# Patient Record
Sex: Male | Born: 1995 | Hispanic: No | Marital: Married | Smoking: Current some day smoker
Health system: Southern US, Community
[De-identification: ages and names within clinical notes are randomized; demographics above are authoritative.]

## PROBLEM LIST (undated history)

## (undated) DIAGNOSIS — F909 Attention-deficit hyperactivity disorder, unspecified type: Secondary | ICD-10-CM

## (undated) DIAGNOSIS — J45909 Unspecified asthma, uncomplicated: Secondary | ICD-10-CM

## (undated) DIAGNOSIS — I499 Cardiac arrhythmia, unspecified: Secondary | ICD-10-CM

## (undated) HISTORY — PX: OTHER SURGICAL HISTORY: SHX169

---

## 2019-04-15 ENCOUNTER — Ambulatory Visit: Payer: Self-pay | Attending: Internal Medicine

## 2019-04-15 DIAGNOSIS — Z23 Encounter for immunization: Secondary | ICD-10-CM

## 2019-04-15 NOTE — Progress Notes (Signed)
   Covid-19 Vaccination Clinic  Name:  Frederick Reynolds    MRN: [id number] DOB: February 26, 1995  04/15/2019  [name] was observed post Covid-19 immunization for 15 minutes without incident. He was provided with Vaccine Information Sheet and instruction to access the V-Safe system.   [name] was instructed to call 911 with any severe reactions post vaccine: Marland Kitchen Difficulty breathing  . Swelling of face and throat  . A fast heartbeat  . A bad rash all over body  . Dizziness and weakness   Immunizations Administered    Name Date Dose VIS Date Route   Pfizer COVID-19 Vaccine 04/15/2019 10:17 AM 0.3 mL 12/18/2018 Intramuscular   Manufacturer: [employer], [employer]   Lot: [id number]   NDC: [address]-8

## 2019-05-12 ENCOUNTER — Ambulatory Visit: Payer: Self-pay

## 2019-05-13 ENCOUNTER — Ambulatory Visit: Payer: Self-pay | Attending: Internal Medicine

## 2019-05-13 DIAGNOSIS — Z23 Encounter for immunization: Secondary | ICD-10-CM

## 2019-05-13 NOTE — Progress Notes (Signed)
   Covid-19 Vaccination Clinic  Name:  Darrick Greenlaw    MRN: [id number] DOB: Sep 27, 1995  05/13/2019  Mr. Garnett was observed post Covid-19 immunization for 15 minutes without incident. He was provided with Vaccine Information Sheet and instruction to access the V-Safe system.   Mr. Bogdon was instructed to call 911 with any severe reactions post vaccine: Marland Kitchen Difficulty breathing  . Swelling of face and throat  . A fast heartbeat  . A bad rash all over body  . Dizziness and weakness   Immunizations Administered    Name Date Dose VIS Date Route   Pfizer COVID-19 Vaccine 05/13/2019 12:30 PM 0.3 mL 03/03/2018 Intramuscular   Manufacturer: [employer], [employer]   Lot: [id number]   NDC: [address]-1

## 2019-05-17 ENCOUNTER — Ambulatory Visit: Payer: Self-pay | Attending: Internal Medicine

## 2019-09-27 ENCOUNTER — Other Ambulatory Visit: Payer: Self-pay

## 2019-09-27 ENCOUNTER — Emergency Department (INDEPENDENT_AMBULATORY_CARE_PROVIDER_SITE_OTHER)
Admission: EM | Admit: 2019-09-27 | Discharge: 2019-09-27 | Disposition: A | Discharge status: Home / Self Care | Payer: Self-pay | Source: Home / Self Care

## 2019-09-27 DIAGNOSIS — R519 Headache, unspecified: Secondary | ICD-10-CM

## 2019-09-27 DIAGNOSIS — R002 Palpitations: Secondary | ICD-10-CM

## 2019-09-27 DIAGNOSIS — R42 Dizziness and giddiness: Secondary | ICD-10-CM

## 2019-09-27 HISTORY — DX: Cardiac arrhythmia, unspecified: I49.9

## 2019-09-27 NOTE — ED Provider Notes (Signed)
[facility]    CSN: [id number] Arrival date & time: 09/27/19  1217      History   Chief Complaint Chief Complaint  Patient presents with  . Dizziness  . Headache  . Shortness of Breath    HPI Frederick Reynolds is a 24 y.o. male.   HPI  Frederick Reynolds is a 24 y.o. male presenting to UC with c/o 1 month of palpitations, intermittent headache, dizziness, and intermittent chest pain.  Pt reports having palpitations since he was a child and was even seen by [facility] when he was about 12 years ago, per medical records it was believed his Concerta may have contributed to some of his symptoms.  Pt had a normal echocardiogram at that time. He is not currently on any prescription medication or supplement but does report trying to cut back on caffeine intake.  He states yesterday after a run he developed worsening palpitations dizziness and HA. He has not taken any medication for the headache. Pt states he does not drink plain water but does drink [place] sparkling water.  No known medical problems besides the palpitations but pt is concerned he may have a brain tumor.  He did have a small tear in his retina, dx after he started seeing floaters a few weeks ago. The tear was unprovoked and surgically repaired. Pt states his vision is back to normal. He does not have a PCP because he moves frequently.  He is currently studying in hopes of getting into law school, sleeps about 6 hours a night.      Past Medical History:  Diagnosis Date  . Irregular heart beat     There are no problems to display for this patient.   Past Surgical History:  Procedure Laterality Date  . retina rupture          Home Medications    Prior to Admission medications   Not on File    Family History Family History  Problem Relation Age of Onset  . Hypertension Father     Social History Social History   Tobacco Use  . Smoking status: Current Some Day Smoker    Types: Cigars  .  Smokeless tobacco: Never Used  Substance Use Topics  . Alcohol use: Yes    Comment: occasionally   . Drug use: Never     Allergies   Patient has no known allergies.   Review of Systems Review of Systems  Constitutional: Negative for chills, fatigue and fever.  HENT: Negative for congestion, ear pain, sore throat, trouble swallowing and voice change.   Eyes: Negative for photophobia and visual disturbance.  Respiratory: Positive for shortness of breath. Negative for cough.   Cardiovascular: Positive for chest pain and palpitations.  Gastrointestinal: Negative for abdominal pain, diarrhea, nausea and vomiting.  Musculoskeletal: Negative for arthralgias, back pain and myalgias.  Skin: Negative for rash.  Neurological: Positive for dizziness and headaches. Negative for light-headedness.  All other systems reviewed and are negative.    Physical Exam Triage Vital Signs ED Triage Vitals  Enc Vitals Group     BP 09/27/19 1253 128/85     Pulse Rate 09/27/19 1253 71     Resp 09/27/19 1253 18     Temp 09/27/19 1253 97.9 F (36.6 C)     Temp Source 09/27/19 1253 Oral     SpO2 09/27/19 1253 99 %     Weight 09/27/19 1254 185 lb (83.9 kg)     Height 09/27/19 1254  5\' 9"  (1.753 m)     Head Circumference --      Peak Flow --      Pain Score 09/27/19 1254 0     Pain Loc --      Pain Edu? --      Excl. in GC? --    No data found.  Updated Vital Signs BP 128/85 (BP Location: Left Arm)   Pulse 71   Temp 97.9 F (36.6 C) (Oral)   Resp 18   Ht 5\' 9"  (1.753 m)   Wt 185 lb (83.9 kg)   SpO2 99%   BMI 27.32 kg/m   Visual Acuity Right Eye Distance:   Left Eye Distance:   Bilateral Distance:    Right Eye Near:   Left Eye Near:    Bilateral Near:     Physical Exam Vitals and nursing note reviewed.  Constitutional:      General: He is not in acute distress.    Appearance: He is well-developed. He is not ill-appearing, toxic-appearing or diaphoretic.  HENT:     Head:  Normocephalic and atraumatic.     Mouth/Throat:     Mouth: Mucous membranes are moist.     Pharynx: Oropharynx is clear.  Eyes:     General: No visual field deficit.    Extraocular Movements: Extraocular movements intact.     Right eye: Normal extraocular motion and no nystagmus.     Left eye: Normal extraocular motion and no nystagmus.     Pupils: Pupils are equal, round, and reactive to light.  Cardiovascular:     Rate and Rhythm: Normal rate and regular rhythm.  Pulmonary:     Effort: Pulmonary effort is normal. No respiratory distress.     Breath sounds: Normal breath sounds. No stridor. No wheezing, rhonchi or rales.  Musculoskeletal:        General: Normal range of motion.     Cervical back: Normal range of motion and neck supple. No rigidity.  Lymphadenopathy:     Cervical: No cervical adenopathy.  Skin:    General: Skin is warm and dry.     Capillary Refill: Capillary refill takes less than 2 seconds.  Neurological:     Mental Status: He is alert and oriented to person, place, and time.     GCS: GCS eye subscore is 4. GCS verbal subscore is 5. GCS motor subscore is 6.     Cranial Nerves: No cranial nerve deficit, dysarthria or facial asymmetry.     Sensory: No sensory deficit.     Motor: No weakness.     Coordination: Romberg sign negative. Coordination normal.     Gait: Gait normal.  Psychiatric:        Mood and Affect: Mood normal.        Behavior: Behavior normal.      UC Treatments / Results  Labs (all labs ordered are listed, but only abnormal results are displayed) Labs Reviewed - No data to display  EKG Date/Time: 09/27/2019    13:19:37 Ventricular Rate: 62 PR Interval: 212 QRS Duration: 86 QT Interval: 364 QTC Calculation: 369 P-R-T axes: 54   74   48 Text Interpretation: Sinus rhythm with sinus arrhythmia with 1st degree AV block. Otherwise normal ECG.   Radiology No results found.  Procedures Procedures (including critical care  time)  Medications Ordered in UC Medications - No data to display  Initial Impression / Assessment and Plan / UC Course  I have reviewed the triage  vital signs and the nursing notes.  Pertinent labs & imaging results that were available during my care of the patient were reviewed by me and considered in my medical decision making (see chart for details).     Reassured pt of overall normal EKG, however, due to bothersome recurrent palpitations, ambulatory referral to cardiology placed today. Pt may benefit from a holter monitor. Normal neuro exam today. Discussed head CT today for intermittent HA and dizziness, will hold off for now due to cost concerns. Discussed signs/symptoms that warrant emergency care or recheck later this week. Encouraged good hydration, at least 8 hours of sleep, may alternate Tylenol and ibuprofen. Encouraged to establish care with PCP even if just for a few more months in the area. AVS given  Final Clinical Impressions(s) / UC Diagnoses   Final diagnoses:  Generalized headache  Dizziness  Palpitations     Discharge Instructions      It is highly recommended you establish care with a primary care provider even if you only plan to be in the area a few more months. A referral has been placed for cardiology as you may benefit from an event monitor that can be worn for several hours or days.  In the meantime, try to get plenty of sleep, at least 8 hours a night, stay well hydrated so your urine stays clear to pale yellow, try alternating Tylenol and Motrin as needed for pain.  Follow up later this week if headache persists or worsens.   Call 911 or have someone drive you to the hospital for further evaluation and treatment of symptoms if you develop worsening headache, change in vision, worsening dizziness/falling/passing out, one sided weakness or numbness, or other new concerning symptoms develop.     ED Prescriptions    None     I have reviewed the  PDMP during this encounter.   Lurene Shadow, [place] 09/27/19 1514

## 2019-09-27 NOTE — Discharge Instructions (Signed)
  It is highly recommended you establish care with a primary care provider even if you only plan to be in the area a few more months. A referral has been placed for cardiology as you may benefit from an event monitor that can be worn for several hours or days.  In the meantime, try to get plenty of sleep, at least 8 hours a night, stay well hydrated so your urine stays clear to pale yellow, try alternating Tylenol and Motrin as needed for pain.  Follow up later this week if headache persists or worsens.   Call 911 or have someone drive you to the hospital for further evaluation and treatment of symptoms if you develop worsening headache, change in vision, worsening dizziness/falling/passing out, one sided weakness or numbness, or other new concerning symptoms develop.

## 2019-09-27 NOTE — ED Triage Notes (Signed)
Pt reports having palpitation, vertigo, dizziness, intermittent chest pain for about a month. Of note, pt has had covid last year but has been fully immunized. He reports having an irregular heart beat as a child. He moves around a lot so he does not have established PCP or seen a cardiologist for this condition.

## 2019-09-28 ENCOUNTER — Telehealth: Payer: Self-pay | Admitting: Emergency Medicine

## 2019-09-28 NOTE — Telephone Encounter (Signed)
2nd attempt to call patient back - no answer

## 2019-09-28 NOTE — Telephone Encounter (Signed)
Call to check on pt status, no answer - will try again later on

## 2019-10-25 ENCOUNTER — Emergency Department (INDEPENDENT_AMBULATORY_CARE_PROVIDER_SITE_OTHER): Payer: Self-pay

## 2019-10-25 ENCOUNTER — Other Ambulatory Visit: Payer: Self-pay

## 2019-10-25 ENCOUNTER — Encounter: Payer: Self-pay | Admitting: Emergency Medicine

## 2019-10-25 ENCOUNTER — Emergency Department (INDEPENDENT_AMBULATORY_CARE_PROVIDER_SITE_OTHER)
Admission: EM | Admit: 2019-10-25 | Discharge: 2019-10-25 | Disposition: A | Discharge status: Home / Self Care | Payer: Self-pay | Source: Home / Self Care

## 2019-10-25 DIAGNOSIS — R519 Headache, unspecified: Secondary | ICD-10-CM

## 2019-10-25 DIAGNOSIS — R42 Dizziness and giddiness: Secondary | ICD-10-CM

## 2019-10-25 MED ORDER — MECLIZINE HCL 25 MG PO TABS: 25.0000 mg | ORAL_TABLET | Freq: Two times a day (BID) | ORAL | 0 refills | Status: AC | PRN

## 2019-10-25 NOTE — ED Triage Notes (Addendum)
Dizziness x 3 weeks, nausea, headaches, denies palpitations. Wants CT Declined Zofran for nausea

## 2019-10-25 NOTE — Discharge Instructions (Signed)
  Due to your ongoing symptoms for at least 1 month, it is recommended you establish care with a primary care provider for ongoing healthcare needs and recheck of symptoms.  You may try the prescribed medication to help with your dizziness: Antivert (meclizine) is a medication to help with dizziness and nausea related to vertigo.  This medication can cause drowsiness. Do not operate heavy machinery or drive while taking.    Call 911 or have someone drive you to the hospital if symptoms significantly worsening- worsening headache, change in vision, passing out, or other new concerning symptoms develop.

## 2019-10-28 NOTE — ED Provider Notes (Signed)
[facility]    CSN: [id number] Arrival date & time: 10/25/19  1235      History   Chief Complaint Chief Complaint  Patient presents with  . Dizziness    HPI Frederick Reynolds is a 24 y.o. male.   HPI  Frederick Reynolds is a 24 y.o. male presenting to UC with c/o 3 weeks of intermittent dizziness, nausea and generalized HA.  Pt was seen at [facility] on 09/27/19 with similar complaints. Pt was encouraged to f/u with PCP and cardiology for reported palpitations at that time. Pt has not f/u since last UC visit. States the palpitations have been going on since childhood, pt more concerned about dizziness and HA today. Requesting a CT scan.  He notes recently going to [facility]. and having a dizzy spell so bad he almost fell, but felt better after drinking some water. Pt unsure if he was dehydrated or just needed to rest.  HA is aching and sore, all over. Dizziness worse with certain head movements. Denies cough, congestion, fever, chills, n/v/d. No ear pain or vision loss. No medications tried PTA but his wife, who works in neurology, tried the Teaching laboratory technician thinking symptoms were vertigo but no relief.     Past Medical History:  Diagnosis Date  . Irregular heart beat     There are no problems to display for this patient.   Past Surgical History:  Procedure Laterality Date  . retina rupture          Home Medications    Prior to Admission medications   Medication Sig Start Date End Date Taking? Authorizing Provider  meclizine (ANTIVERT) 25 MG tablet Take 1 tablet (25 mg total) by mouth 2 (two) times daily as needed for dizziness or nausea. 10/25/19   Lurene Shadow, PA-C    Family History Family History  Problem Relation Age of Onset  . Hypertension Father   . Healthy Mother     Social History Social History   Tobacco Use  . Smoking status: Current Some Day Smoker    Types: Cigars  . Smokeless tobacco: Never Used  Vaping Use  . Vaping Use: Never used    Substance Use Topics  . Alcohol use: Yes    Comment: occasionally   . Drug use: Never     Allergies   Patient has no known allergies.   Review of Systems Review of Systems  Constitutional: Negative for chills and fever.  HENT: Negative for congestion.   Eyes: Negative for photophobia, pain and visual disturbance.  Gastrointestinal: Negative for nausea and vomiting.  Musculoskeletal: Negative for back pain, neck pain and neck stiffness.  Neurological: Positive for dizziness and headaches. Negative for weakness and light-headedness.     Physical Exam Triage Vital Signs ED Triage Vitals  Enc Vitals Group     BP 10/25/19 1336 (!) 144/83     Pulse Rate 10/25/19 1336 82     Resp --      Temp 10/25/19 1336 98.5 F (36.9 C)     Temp Source 10/25/19 1336 Oral     SpO2 10/25/19 1336 98 %     Weight 10/25/19 1337 188 lb (85.3 kg)     Height 10/25/19 1337 5\' 9"  (1.753 m)     Head Circumference --      Peak Flow --      Pain Score 10/25/19 1337 0     Pain Loc --      Pain Edu? --  Excl. in GC? --    No data found.  Updated Vital Signs BP (!) 144/83 (BP Location: Right Arm)   Pulse 82   Temp 98.5 F (36.9 C) (Oral)   Ht 5\' 9"  (1.753 m)   Wt 188 lb (85.3 kg)   SpO2 98%   BMI 27.76 kg/m   Visual Acuity Right Eye Distance:   Left Eye Distance:   Bilateral Distance:    Right Eye Near:   Left Eye Near:    Bilateral Near:     Physical Exam Vitals and nursing note reviewed.  Constitutional:      General: He is not in acute distress.    Appearance: Normal appearance. He is well-developed. He is not ill-appearing, toxic-appearing or diaphoretic.  HENT:     Head: Normocephalic and atraumatic.     Right Ear: Tympanic membrane and ear canal normal.     Left Ear: Tympanic membrane and ear canal normal.     Nose: Nose normal.     Mouth/Throat:     Mouth: Mucous membranes are moist.     Pharynx: Oropharynx is clear.  Eyes:     Extraocular Movements: Extraocular  movements intact.     Conjunctiva/sclera: Conjunctivae normal.     Pupils: Pupils are equal, round, and reactive to light.  Cardiovascular:     Rate and Rhythm: Normal rate and regular rhythm.  Pulmonary:     Effort: Pulmonary effort is normal. No respiratory distress.     Breath sounds: Normal breath sounds.  Musculoskeletal:        General: Normal range of motion.     Cervical back: Normal range of motion and neck supple. No rigidity or tenderness.  Lymphadenopathy:     Cervical: No cervical adenopathy.  Skin:    General: Skin is warm and dry.     Capillary Refill: Capillary refill takes less than 2 seconds.  Neurological:     General: No focal deficit present.     Mental Status: He is alert and oriented to person, place, and time.     Cranial Nerves: No cranial nerve deficit.     Sensory: No sensory deficit.     Motor: No weakness.     Coordination: Coordination normal.     Gait: Gait normal.  Psychiatric:        Mood and Affect: Mood normal.        Behavior: Behavior normal.      UC Treatments / Results  Labs (all labs ordered are listed, but only abnormal results are displayed) Labs Reviewed - No data to display  EKG   Radiology Narrative & Impression  CLINICAL DATA:  Headaches and dizziness for the past 3 weeks.  EXAM: CT HEAD WITHOUT CONTRAST  TECHNIQUE: Contiguous axial images were obtained from the base of the skull through the vertex without intravenous contrast.  COMPARISON:  None.  FINDINGS: Brain: No evidence of acute infarction, hemorrhage, hydrocephalus, extra-axial collection or mass lesion/mass effect.  Vascular: No hyperdense vessel or unexpected calcification.  Skull: Normal. Negative for fracture or focal lesion.  Sinuses/Orbits: No acute finding.  Other: None.  IMPRESSION: 1. Normal noncontrast head CT.   Electronically Signed   By: M.D.   On: 10/25/2019 15:04      Procedures Procedures  (including critical care time)  Medications Ordered in UC Medications - No data to display  Initial Impression / Assessment and Plan / UC Course  I have reviewed the triage vital signs and  the nursing notes.  Pertinent labs & imaging results that were available during my care of the patient were reviewed by me and considered in my medical decision making (see chart for details).     Reassured pt of normal head CT and orthostatic vitals May try trial of meclizine Encouraged to establish care with a PCP for further evaluation and treatment of recurrent symptoms.  Discussed symptoms that warrant emergent care in the ED. AVS given  Final Clinical Impressions(s) / UC Diagnoses   Final diagnoses:  Dizziness  Recurrent headache     Discharge Instructions      Due to your ongoing symptoms for at least 1 month, it is recommended you establish care with a primary care provider for ongoing healthcare needs and recheck of symptoms.  You may try the prescribed medication to help with your dizziness: Antivert (meclizine) is a medication to help with dizziness and nausea related to vertigo.  This medication can cause drowsiness. Do not operate heavy machinery or drive while taking.    Call 911 or have someone drive you to the hospital if symptoms significantly worsening- worsening headache, change in vision, passing out, or other new concerning symptoms develop.       ED Prescriptions    Medication Sig Dispense Auth. Provider   meclizine (ANTIVERT) 25 MG tablet Take 1 tablet (25 mg total) by mouth 2 (two) times daily as needed for dizziness or nausea. 9 tablet Lurene Shadow, [place]     PDMP not reviewed this encounter.   Lurene Shadow, [place] 10/28/19 1151

## 2019-12-07 ENCOUNTER — Telehealth: Payer: Self-pay | Admitting: Emergency Medicine

## 2019-12-07 NOTE — Telephone Encounter (Signed)
Call from [facility] to sse who is was supposed to follow up with from last visit at [facility] - PCP  Was Dr Everrett Coombe & ENT was [facility]. RN explained that contact info was on his AVS & in My Chart if he had set it up w/ [facility]. RN offered to give Louis the contact #'s for both, but he stated he had the info.

## 2020-10-05 ENCOUNTER — Emergency Department (HOSPITAL_BASED_OUTPATIENT_CLINIC_OR_DEPARTMENT_OTHER): Admission: EM | Admit: 2020-10-05 | Discharge: 2020-10-05 | Discharge status: Against Medical Advice | Payer: Self-pay

## 2020-10-05 ENCOUNTER — Other Ambulatory Visit: Payer: Self-pay

## 2020-10-06 ENCOUNTER — Emergency Department (HOSPITAL_BASED_OUTPATIENT_CLINIC_OR_DEPARTMENT_OTHER)
Admission: EM | Admit: 2020-10-06 | Discharge: 2020-10-06 | Disposition: A | Discharge status: Home / Self Care | Payer: Self-pay | Attending: Emergency Medicine | Admitting: Emergency Medicine

## 2020-10-06 ENCOUNTER — Encounter (HOSPITAL_BASED_OUTPATIENT_CLINIC_OR_DEPARTMENT_OTHER): Payer: Self-pay | Admitting: Emergency Medicine

## 2020-10-06 DIAGNOSIS — J45909 Unspecified asthma, uncomplicated: Secondary | ICD-10-CM | POA: Diagnosis not present

## 2020-10-06 DIAGNOSIS — Z87891 Personal history of nicotine dependence: Secondary | ICD-10-CM | POA: Insufficient documentation

## 2020-10-06 DIAGNOSIS — Z760 Encounter for issue of repeat prescription: Secondary | ICD-10-CM | POA: Insufficient documentation

## 2020-10-06 HISTORY — DX: Unspecified asthma, uncomplicated: J45.909

## 2020-10-06 MED ORDER — ALBUTEROL SULFATE HFA 108 (90 BASE) MCG/ACT IN AERS
2.0000 | INHALATION_SPRAY | RESPIRATORY_TRACT | Status: DC | PRN
  Administered 2020-10-06: 2 via RESPIRATORY_TRACT
  Filled 2020-10-06: qty 6.7

## 2020-10-06 MED ORDER — PANTOPRAZOLE SODIUM 40 MG PO TBEC
40.0000 mg | DELAYED_RELEASE_TABLET | Freq: Once | ORAL | Status: AC
  Administered 2020-10-06: 40 mg via ORAL
  Filled 2020-10-06: qty 1

## 2020-10-06 NOTE — ED Provider Notes (Signed)
[facility] Provider Note: Lowella Dell, MD, FACEP  CSN: [id number] MRN: [id number] ARRIVAL: 10/06/20 at 0505 ROOM: MH02/MH02   CHIEF COMPLAINT  Medication Refill   HISTORY OF PRESENT ILLNESS  10/06/20 5:25 AM Frederick Reynolds is a 25 y.o. male with a history of asthma.  He is here requesting a new inhaler.  He thinks he lost his old one yesterday during a move.  He is not significantly short of breath at the present time but it gets worse when he is lying down at night.  It is associated with a burning sensation in his lungs.  He does not believe he has acid reflux and has never been told he had acid reflux.  He has never been treated for acid reflux.    Past Medical History:  Diagnosis Date   Asthma    Irregular heart beat     Past Surgical History:  Procedure Laterality Date   retina rupture       Family History  Problem Relation Age of Onset   Hypertension Father    Healthy Mother     Social History   Tobacco Use   Smoking status: Former    Types: Cigars   Smokeless tobacco: Never  Vaping Use   Vaping Use: Never used  Substance Use Topics   Alcohol use: Yes    Comment: occasionally    Drug use: Never    Prior to Admission medications   Medication Sig Start Date End Date Taking? Authorizing Provider  meclizine (ANTIVERT) 25 MG tablet Take 1 tablet (25 mg total) by mouth 2 (two) times daily as needed for dizziness or nausea. 10/25/19   Lurene Shadow, PA-C    Allergies Patient has no known allergies.   REVIEW OF SYSTEMS  Negative except as noted here or in the History of Present Illness.   PHYSICAL EXAMINATION  Initial Vital Signs Blood pressure 122/79, pulse 62, temperature 98.3 F (36.8 C), temperature source Oral, resp. rate 20, height 5\' 7"  (1.702 m), weight 86.2 kg, SpO2 96 %.  Examination General: Well-developed, well-nourished male in no acute distress; appearance consistent with age of record HENT: normocephalic;  atraumatic Eyes: Normal appearance Neck: supple Heart: regular rate and rhythm Lungs: clear to auscultation bilaterally; occasional cough Abdomen: soft; nondistended; nontender; bowel sounds present Extremities: No deformity; full range of motion Neurologic: Awake, alert and oriented; motor function intact in all extremities and symmetric; no facial droop Skin: Warm and dry Psychiatric: Normal mood and affect   RESULTS  Summary of this visit's results, reviewed and interpreted by myself:   EKG Interpretation  Date/Time:    Ventricular Rate:    PR Interval:    QRS Duration:   QT Interval:    QTC Calculation:   R Axis:     Text Interpretation:         Laboratory Studies: No results found for this or any previous visit (from the past 24 hour(s)). Imaging Studies: No results found.  ED COURSE and MDM  Nursing notes, initial and subsequent vitals signs, including pulse oximetry, reviewed and interpreted by myself.  Vitals:   10/06/20 0514 10/06/20 0515  BP:  122/79  Pulse:  62  Resp:  20  Temp:  98.3 F (36.8 C)  TempSrc:  Oral  SpO2:  96%  Weight: 86.2 kg   Height: 5\' 7"  (1.702 m)    Medications  pantoprazole (PROTONIX) EC tablet 40 mg (has no administration in time range)  albuterol (VENTOLIN HFA)  108 (90 Base) MCG/ACT inhaler 2 puff (has no administration in time range)    Patient advised he may benefit from taking an acid blocker such as Prilosec or Prevacid OTC.  Exacerbation of symptoms while at night is often a sign of acid reflux.  PROCEDURES  Procedures   ED DIAGNOSES     ICD-10-CM   1. Encounter for medication refill  Z76.0          Kaisyn Millea, Jonny Ruiz, MD 10/06/20 [phone]

## 2020-10-06 NOTE — ED Triage Notes (Signed)
Pt request new inhaler. Thinks lost old one yesterday during a move.

## 2020-10-09 ENCOUNTER — Ambulatory Visit (INDEPENDENT_AMBULATORY_CARE_PROVIDER_SITE_OTHER): Payer: Self-pay

## 2020-10-09 ENCOUNTER — Telehealth: Payer: Self-pay | Admitting: Pulmonary Disease

## 2020-10-09 ENCOUNTER — Encounter: Payer: Self-pay | Admitting: Pulmonary Disease

## 2020-10-09 ENCOUNTER — Other Ambulatory Visit: Payer: Self-pay

## 2020-10-09 ENCOUNTER — Ambulatory Visit (INDEPENDENT_AMBULATORY_CARE_PROVIDER_SITE_OTHER): Payer: Self-pay | Admitting: Pulmonary Disease

## 2020-10-09 VITALS — BP 118/82 | HR 69 | Temp 98.2°F | Ht 67.0 in | Wt 191.4 lb

## 2020-10-09 DIAGNOSIS — J42 Unspecified chronic bronchitis: Secondary | ICD-10-CM

## 2020-10-09 MED ORDER — TRELEGY ELLIPTA 200-62.5-25 MCG/INH IN AEPB: 1.0000 | INHALATION_SPRAY | Freq: Every day | RESPIRATORY_TRACT | 0 refills | Status: AC

## 2020-10-09 NOTE — Progress Notes (Signed)
Subjective:   [name]: Frederick Reynolds GENDER: male DOB: 28-Apr-1995, MRN: [id number]   HPI  Chief Complaint  Patient presents with   Consult    After covid, at night patient has noticed a wheezing sound, with burning sensation in chest. SOB has worsened since COVID.   Reason for Visit: New consult for shortness of breath  Mr. Frederick Reynolds is a 25 year old male negligible smoke history with allergic rhinitis, asthma and ADHD who presents as a new consult.  He was referred by his PCP for shortness of breath concerning for post-covid vs asthma. Records reviewed including PCP note on 09/05/20. He recently moved to [place]. Diagnosed with COVID-19 in 2020. Evaluated in the ED however did not require hospitalization. Has tried albuterol inhaler with improvement. He continues to have cough but able to perform regular activities including running and lifting. Has also noticed palpitations that has previously been worked up with monitor in the past. He was started on Symbicort and singulair. Labs reviewed and significant for elevated Cr (1.66) and normal BUN. WBC 5.7 - no diff available.  He reports worsening allergies including sneezing, cough, wheezing, nasal congestion in the last year. At night he reports he can feel and hear vibration in his left chest that feels like it is burning. His wife has performed percussions and sometimes will produce sputum with relief. He is active at baseline and runs, weight lifting, running. Denies symptoms with activity. He has not taken Symbicort due to insurance. He feels albuterol improves.  Increased humidity worsen symptoms but going outside helps with his symptoms. He is concerned that his prior apartment may have had mold exposure. Recently started having a bunny.   Social History: Smoked cigars No vaping or illicit drug use Denies childhood illnesses Martial arts Bunny in home   I have personally reviewed patient's past medical/family/social  history, allergies, current medications.  Past Medical History:  Diagnosis Date   Asthma    Irregular heart beat      Family History  Problem Relation Age of Onset   Hypertension Father    Healthy Mother      Social History   Occupational History   Not on file  Tobacco Use   Smoking status: Former    Types: Cigars   Smokeless tobacco: Never  Vaping Use   Vaping Use: Never used  Substance and Sexual Activity   Alcohol use: Yes    Comment: occasionally    Drug use: Never   Sexual activity: Yes    No Known Allergies   Outpatient Medications Prior to Visit  Medication Sig Dispense Refill   meclizine (ANTIVERT) 25 MG tablet Take 1 tablet (25 mg total) by mouth 2 (two) times daily as needed for dizziness or nausea. 9 tablet 0   No facility-administered medications prior to visit.    Review of Systems  Constitutional:  Negative for chills, diaphoresis, fever, malaise/fatigue and weight loss.  HENT:  Positive for congestion. Negative for ear pain and sore throat.   Respiratory:  Positive for cough and shortness of breath. Negative for hemoptysis, sputum production and wheezing.   Cardiovascular:  Negative for chest pain, palpitations and leg swelling.  Gastrointestinal:  Negative for abdominal pain, heartburn and nausea.  Genitourinary:  Negative for frequency.  Musculoskeletal:  Negative for joint pain and myalgias.  Skin:  Negative for itching and rash.  Neurological:  Negative for dizziness, weakness and headaches.  Endo/Heme/Allergies:  Does not bruise/bleed easily.  Psychiatric/Behavioral:  Negative  for depression. The patient is not nervous/anxious.     Objective:   Vitals:   10/09/20 1527  BP: 118/82  Pulse: 69  Temp: 98.2 F (36.8 C)  SpO2: 97%  Weight: 191 lb 6.4 oz (86.8 kg)  Height: _0  (1.702 m)      Physical Exam: General: Well-appearing, no acute distress HENT: [place], AT Eyes: EOMI, no scleral icterus Respiratory: Clear to auscultation  bilaterally.  No crackles, wheezing or rales Cardiovascular: RRR, -M/R/G, no JVD Extremities:-Edema,-tenderness Neuro: AAO x4, CNII-XII grossly intact Psych: Normal mood, normal affect  Data Reviewed:  Imaging: No chest imaging available   PFT: None on file  Labs: CBC No results found for: WBC, RBC, HGB, HCT, PLT, MCV, MCH, MCHC, RDW, LYMPHSABS, MONOABS, EOSABS, BASOSABS OSH Labs reviewed and significant for elevated Cr (1.66) and normal BUN. WBC 5.7 - no diff available.    Assessment & Plan:   Discussion: 25 year old male negligible smoke history with allergic rhinitis and ADHD who presents for new consult. He reports myriad of symptoms that could be allergy/asthma-related. We discussed conservative management including avoidance of allergens but will also evaluation with PFTs to rule out underlying obstructive/restrictive defect. His history of COVID in 2020 may contribute to this however unclear at this time.  Chronic bronchitis START Breztri TWO puffs TWICE a day ARRANGE pulmonary function tests Obtain CXR today  Health Maintenance Immunization History  Administered Date(s) Administered   PFIZER(Purple Top)SARS-COV-2 Vaccination 04/15/2019, 05/13/2019   CT Lung Screen - not indicated  Orders Placed This Encounter  Procedures   DG Chest 2 View    Standing Status:   Future    Number of Occurrences:   1    Standing Expiration Date:   10/09/2021    Order Specific Question:   Reason for Exam (SYMPTOM  OR DIAGNOSIS REQUIRED)    Answer:   chronic bronchitis    Order Specific Question:   Preferred imaging location?    Answer:   Internal   Pulmonary function test    Standing Status:   Future    Standing Expiration Date:   10/09/2021    Order Specific Question:   Where should this test be performed?    Answer:   [facility]    Order Specific Question:   Full PFT: includes the following: basic spirometry, spirometry pre & post bronchodilator, diffusion capacity (DLCO),  lung volumes    Answer:   Full PFT   Meds ordered this encounter  Medications   Fluticasone-Umeclidin-Vilant (TRELEGY ELLIPTA) 200-62.5-25 MCG/INH AEPB    Sig: Inhale 1 puff into the lungs daily.    Dispense:  14 each    Refill:  0    Order Specific Question:   Lot Number?    Answer:   [id number]    Order Specific Question:   Expiration Date?    Answer:   03/08/2022    Order Specific Question:   Manufacturer?    Answer:   [employer] [[id number]]    Order Specific Question:   Quantity    Answer:   1    No follow-ups on file. After PFTs  I have spent a total time of 45-minutes on the day of the appointment reviewing prior documentation, coordinating care and discussing medical diagnosis and plan with the patient/family. Imaging, labs and tests included in this note have been reviewed and interpreted independently by me.  [place], MD [facility] 10/09/2020 3:25 PM  Office Number [phone]

## 2020-10-09 NOTE — Patient Instructions (Addendum)
Chronic bronchitis START Trelegy ONE puff ONCE a day ARRANGE pulmonary function tests Obtain CXR today  Please call our office to let me know how well the inhaler is working  Follow-up with me after PFTs

## 2020-10-09 NOTE — Telephone Encounter (Signed)
Dr. Everardo All reviewed pt's cxr and stated that the cxr showed no acute abnormalities.   Tried to call pt to let him know the results of the cxr and also to schedule pt's f/u appt but unable to reach and unable to leave VM due to mailbox being full.  Will try to call back later.  Pt does need to be scheduled for a PFT and F/U with Dr. Everardo All next avail.

## 2020-10-11 NOTE — Telephone Encounter (Signed)
Patient returned call, provided results per Dr. Everardo All.  He verbalized understanding.  Patient cannot have PFT and f/u in our office as we are not in network for his insurance.  Advised I would make Dr. Everardo All aware.

## 2020-10-11 NOTE — Telephone Encounter (Signed)
Routing to front desk pool for help in trying to get pt scheduled for the follow up. Pt will need to have 1 hour PFT prior to OV with Dr. Everardo All scheduled next avail.

## 2020-10-13 NOTE — Telephone Encounter (Signed)
What is the standard protocol to verify patient is in network with our office?

## 2020-10-13 NOTE — Telephone Encounter (Signed)
Thanks for info. Our office will await patient if he wishes to pursue further care with [place].

## 2020-10-13 NOTE — Telephone Encounter (Signed)
Typically we have the patient contact their insurance to confirm. Left message with patient to confirm. Since he is telling us we are not in network he probably already has contacted them or been contacted by them Only other option is to have him referred to a pulmonologist in network or ask if he is willing to pay out of pocket. [facility] does automatically give a discount for those that pay out of pocket.

## 2020-11-10 IMAGING — CT CT HEAD W/O CM
3 of 4 series · 16 of 47 positions shown, 19 images · non-contrast
Comparison: None.

CLINICAL DATA: Headaches and dizziness for the past 3 weeks.

EXAM:
CT HEAD WITHOUT CONTRAST
TECHNIQUE: Contiguous axial images were obtained from the base of the skull
through the vertex without intravenous contrast.

[Series 2: head wo · axial · 0.43mm/px · z∈[-184,-48]mm · 10 of 33 slices shown, 13 images]
[im 3/33  brain]
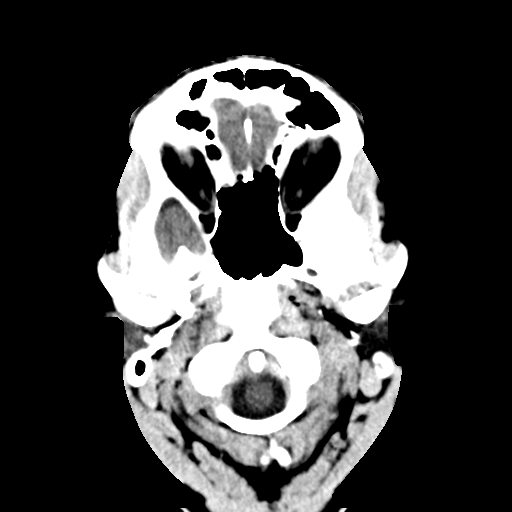
[im 3/33  bone]
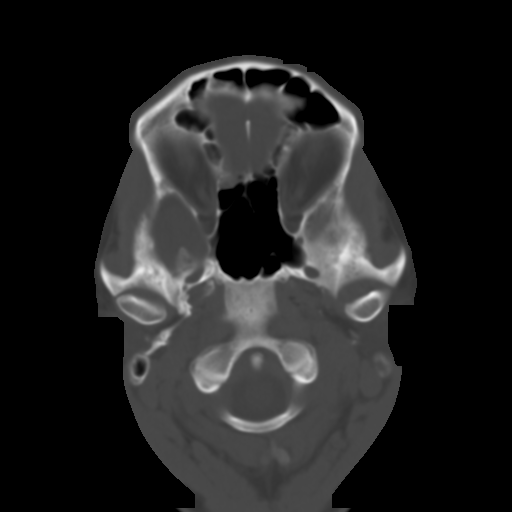
[im 5/33  brain]
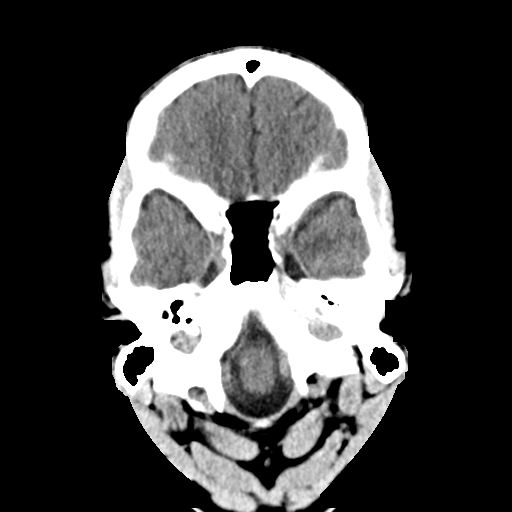
[im 10/33  brain]
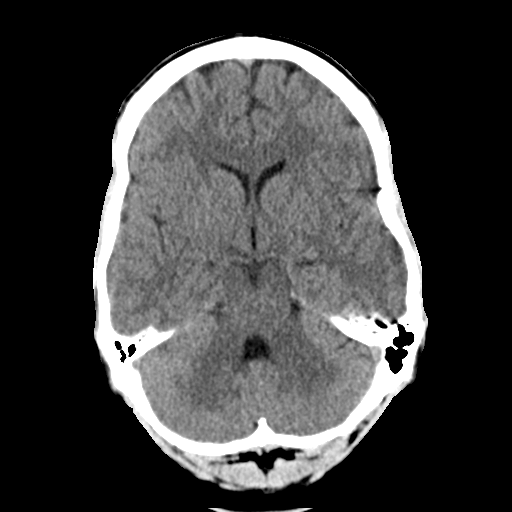
[im 12/33  brain]
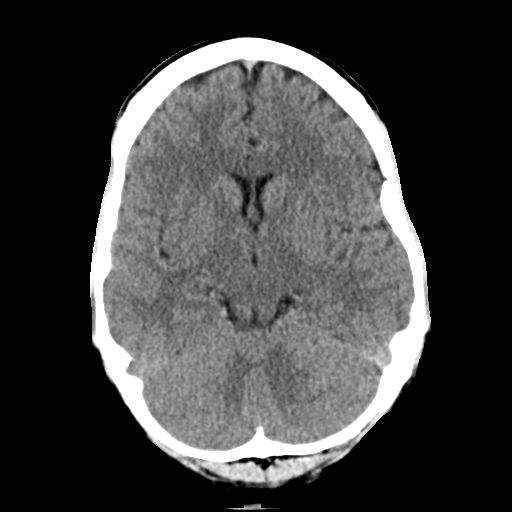
[im 14/33  brain]
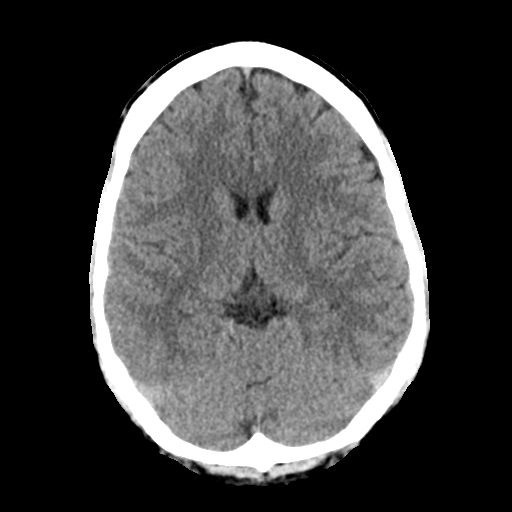
[im 14/33  bone]
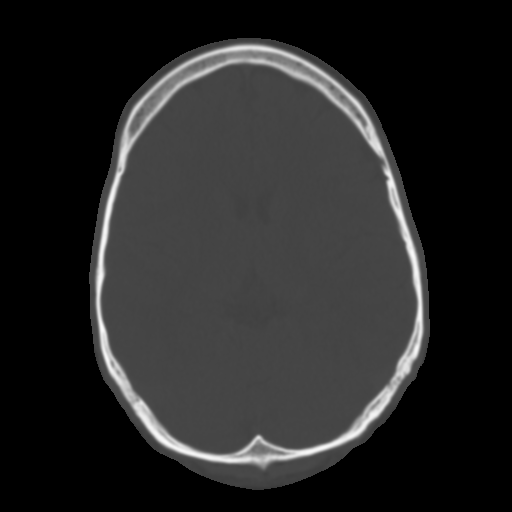
[im 19/33  brain]
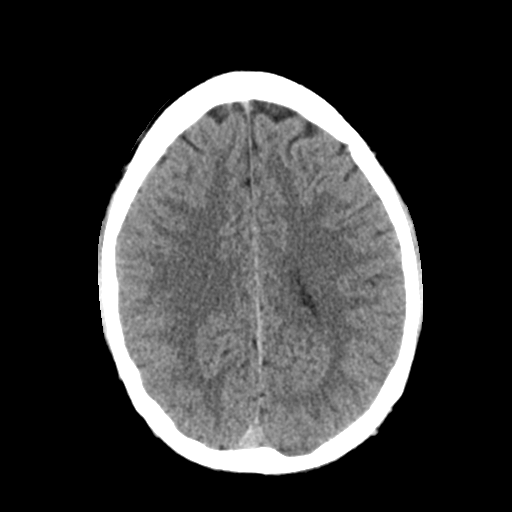
[im 21/33  brain]
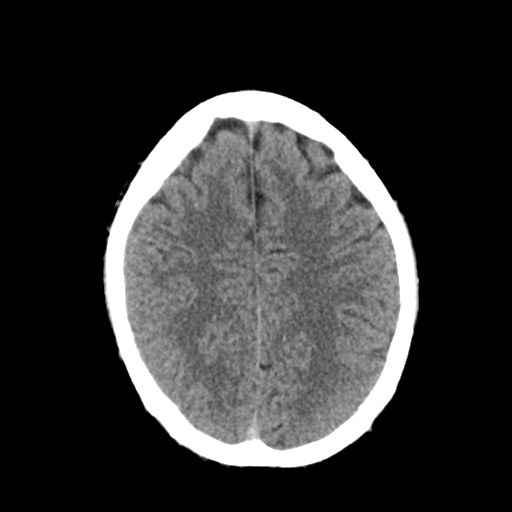
[im 23/33  brain]
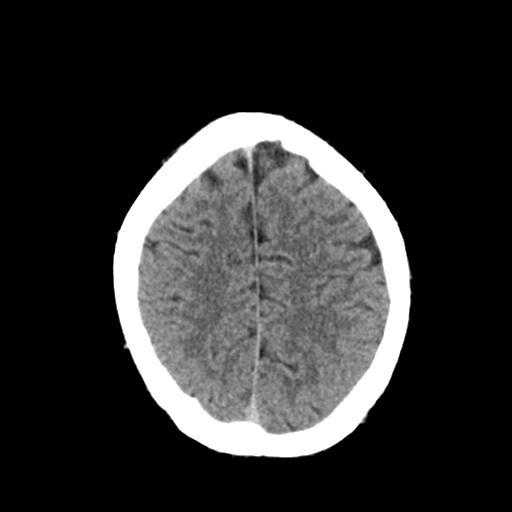
[im 28/33  brain]
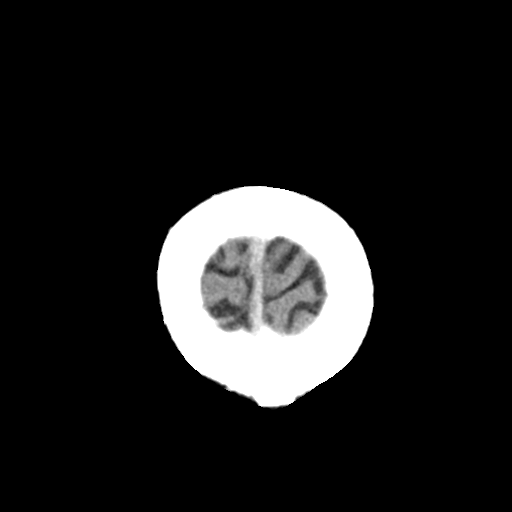
[im 28/33  bone]
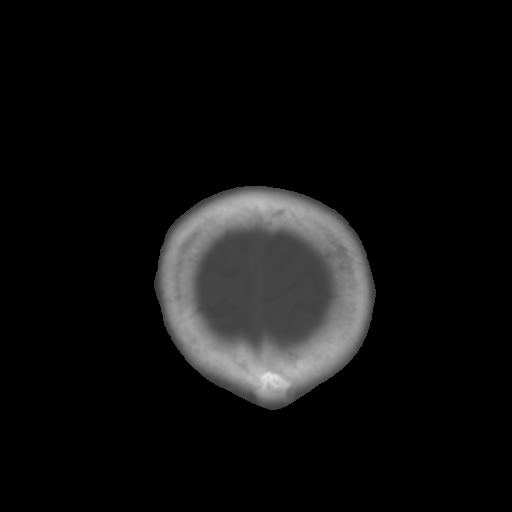
[im 30/33  brain]
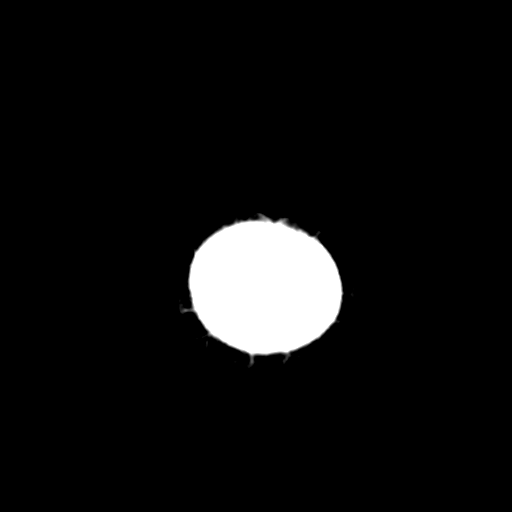

[Series 4: head wo coronal · coronal · 0.33mm/px · 3 of 72 slices shown]
[im 24/72  brain]
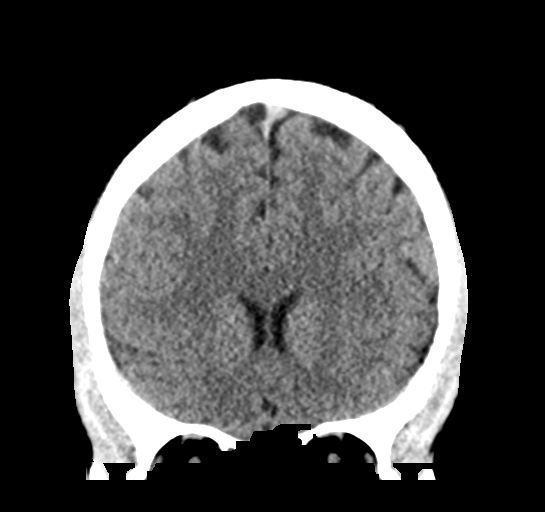
[im 32/72  brain]
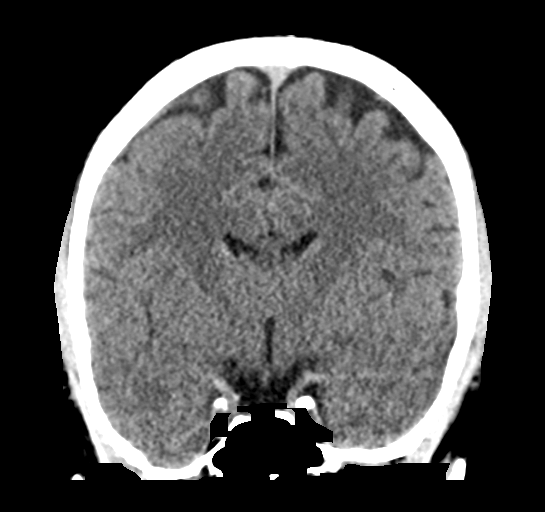
[im 40/72  brain]
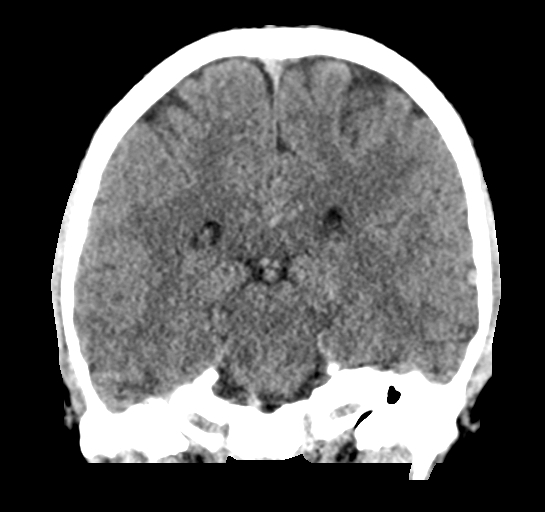

[Series 5: head wo sagittal · sagittal · 0.33mm/px · 3 of 55 slices shown]
[im 19/55  brain]
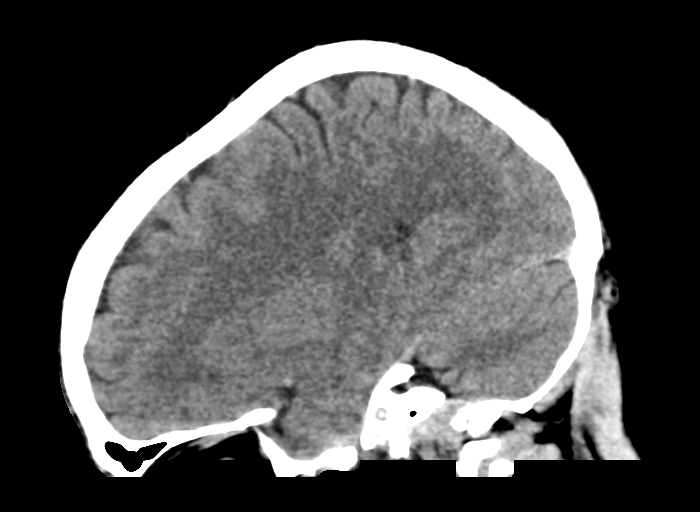
[im 28/55  brain]
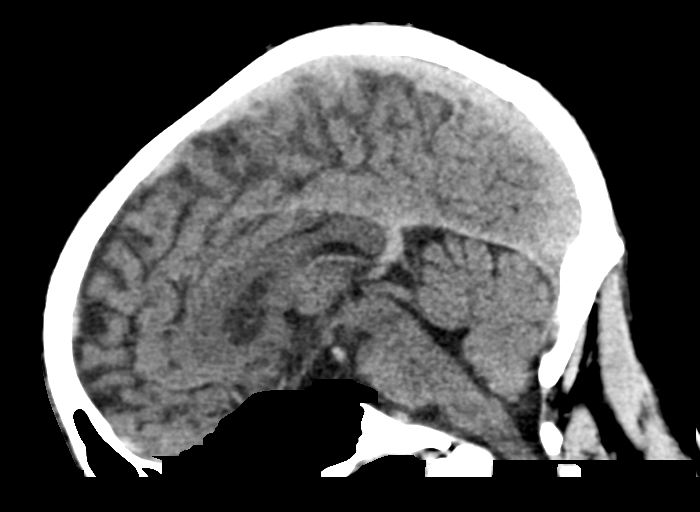
[im 37/55  brain]
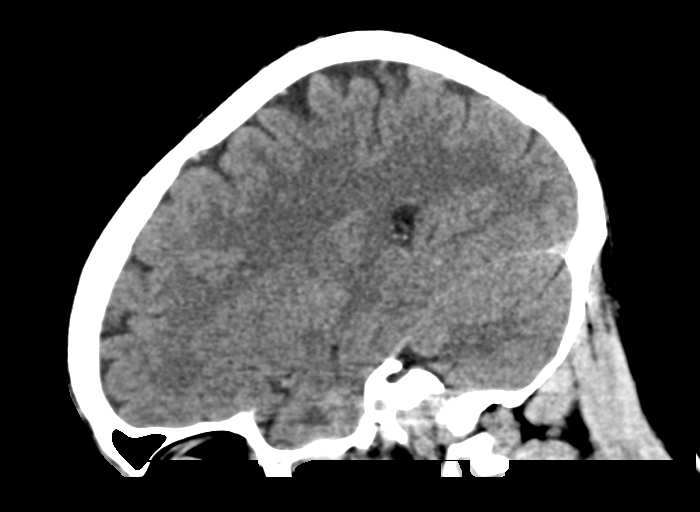

[16 of 47 positions shown; findings below may reference images not displayed]

FINDINGS: Brain: No evidence of acute infarction, hemorrhage, hydrocephalus,
extra-axial collection or mass lesion/mass effect.

Vascular: No hyperdense vessel or unexpected calcification.

Skull: Normal. Negative for fracture or focal lesion.

Sinuses/Orbits: No acute finding.

Other: None.
IMPRESSION: 1. Normal noncontrast head CT.

## 2021-10-26 IMAGING — DX DG CHEST 2V
2 series · 2 of 2 positions shown · non-contrast
Comparison: None.

CLINICAL DATA: Chronic bronchitis

EXAM:
CHEST - 2 VIEW

[chest pa]
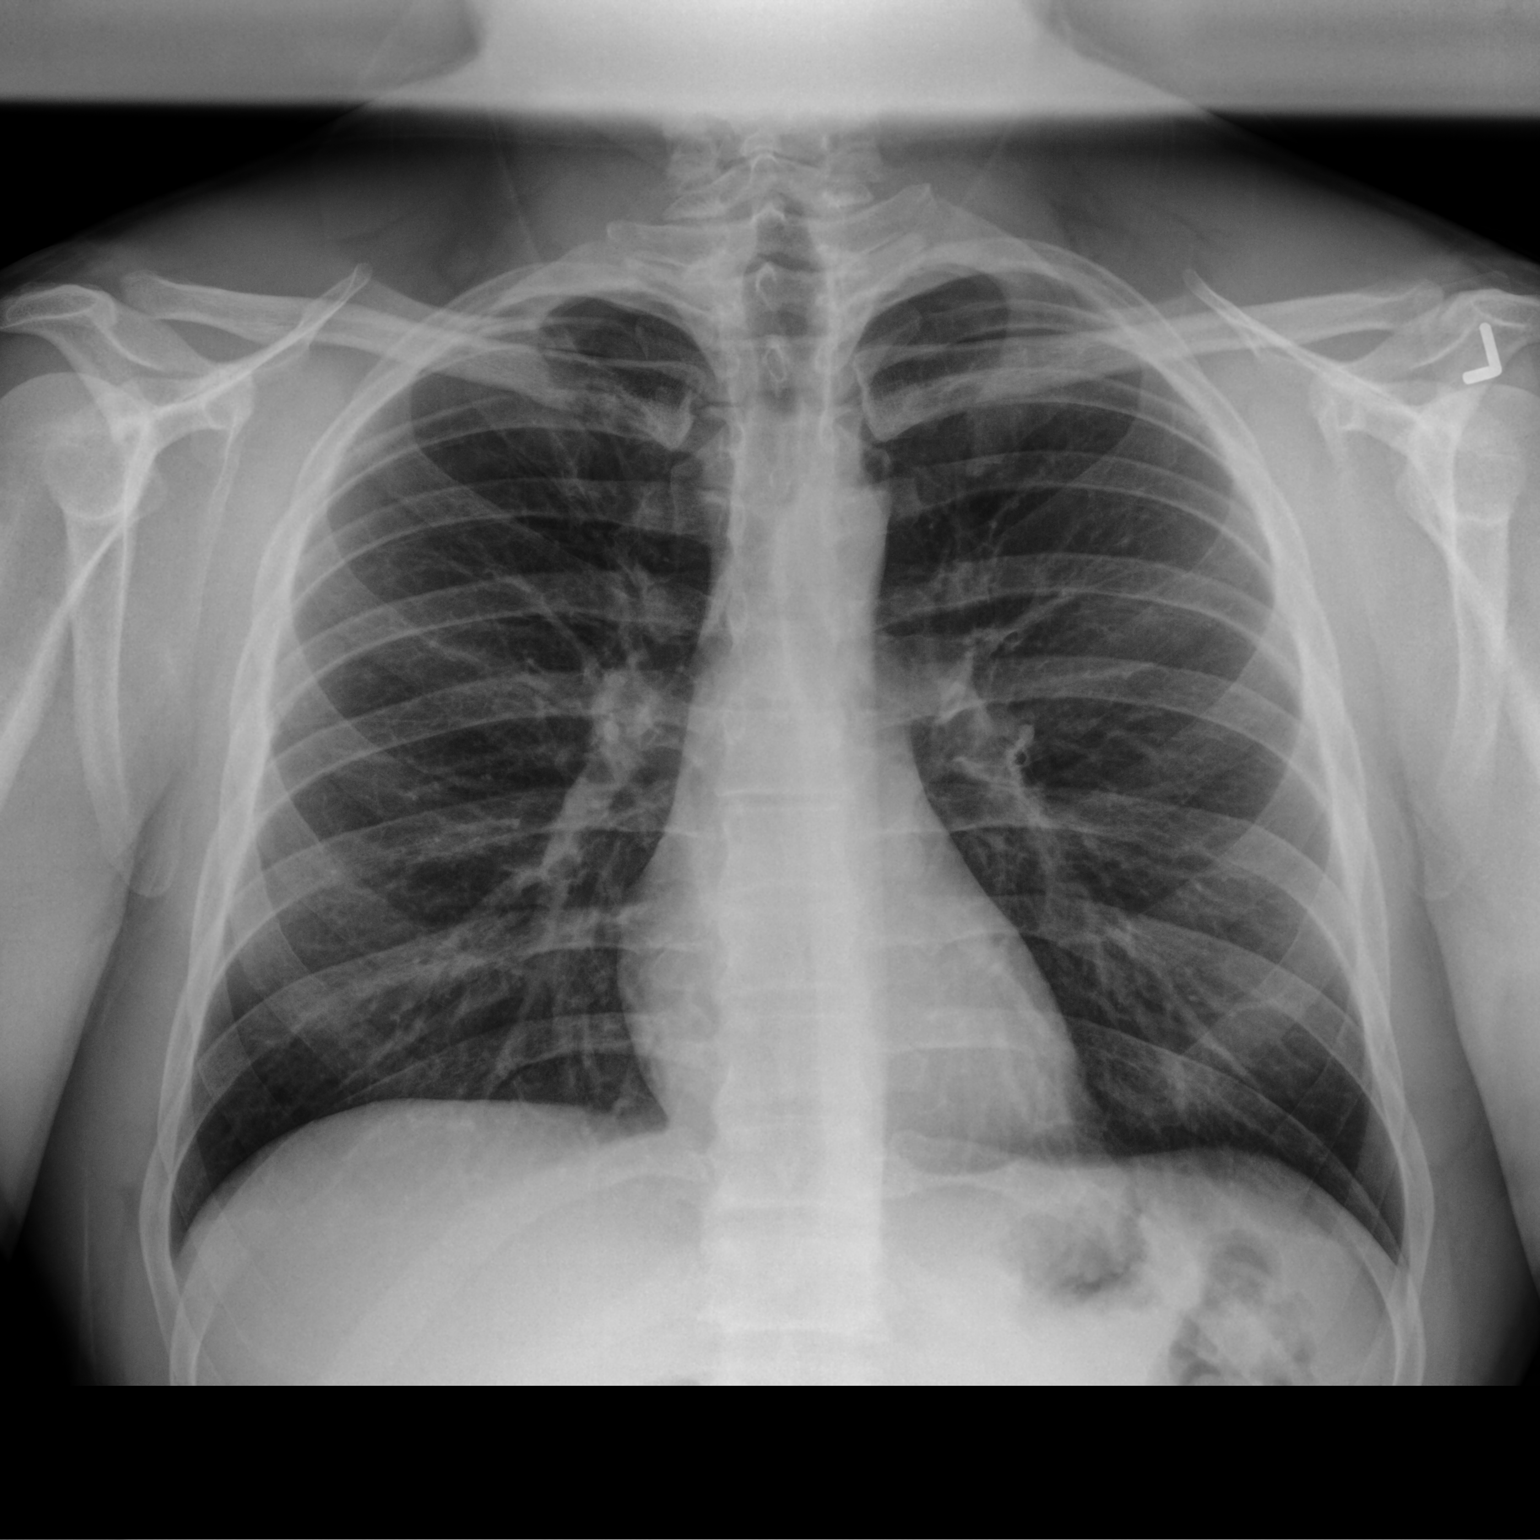

[chest lat]
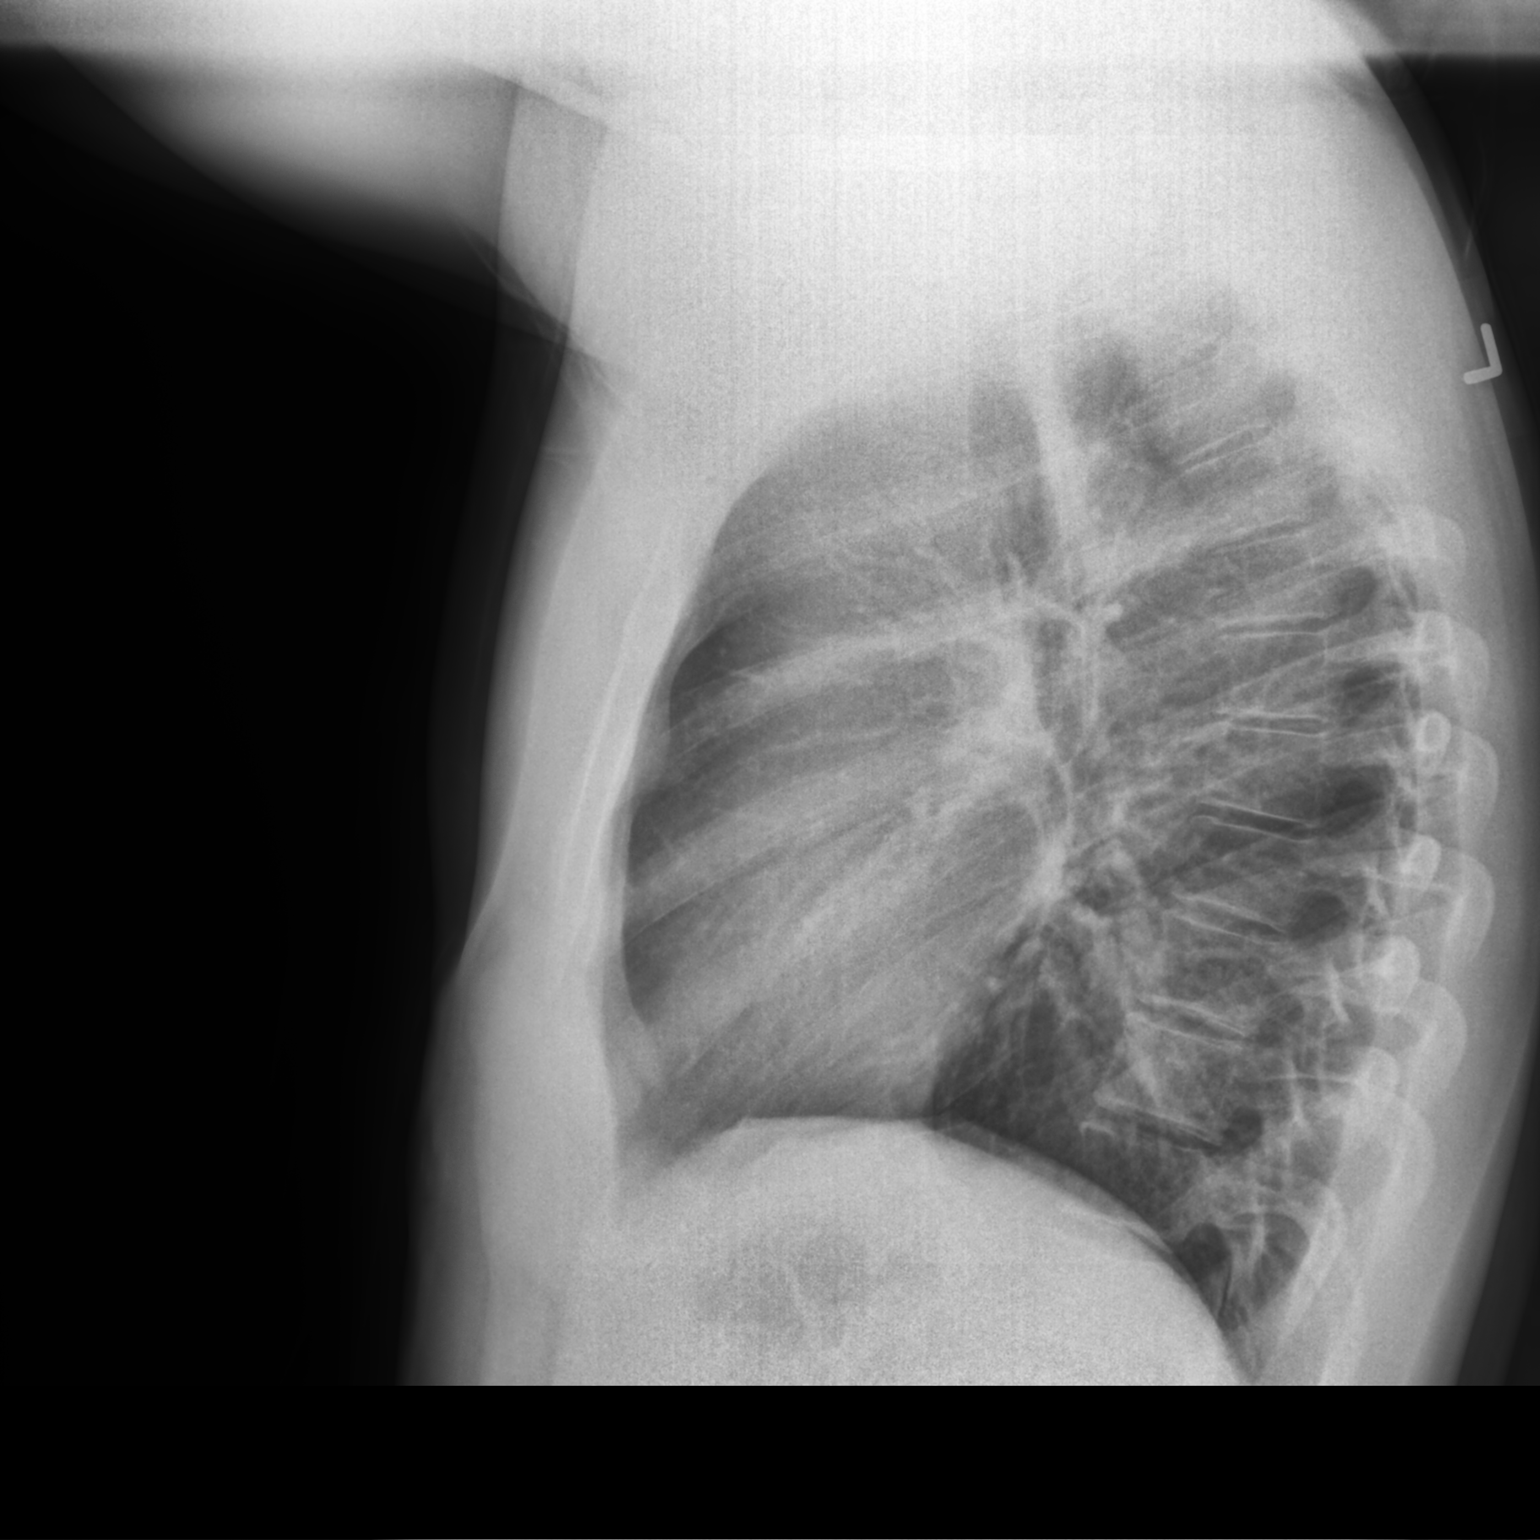

[2 of 2 positions shown; findings below may reference images not displayed]

FINDINGS: The heart size and mediastinal contours are within normal limits.
Both lungs are clear. The visualized skeletal structures are
unremarkable.
IMPRESSION: No active cardiopulmonary disease.

## 2022-02-19 ENCOUNTER — Ambulatory Visit
Admission: EM | Admit: 2022-02-19 | Discharge: 2022-02-19 | Disposition: A | Discharge status: Home / Self Care | Payer: BC Managed Care – PPO | Attending: Urgent Care | Admitting: Urgent Care

## 2022-02-19 DIAGNOSIS — R220 Localized swelling, mass and lump, head: Secondary | ICD-10-CM | POA: Diagnosis not present

## 2022-02-19 MED ORDER — PREDNISONE 20 MG PO TABS: ORAL_TABLET | ORAL | 0 refills | Status: DC

## 2022-02-19 MED ORDER — HYDROXYZINE HCL 25 MG PO TABS: 12.5000 mg | ORAL_TABLET | Freq: Three times a day (TID) | ORAL | 0 refills | Status: AC | PRN

## 2022-02-19 NOTE — Discharge Instructions (Addendum)
We will manage for an allergic reaction using prednisone for 5 days, use hydroxyzine for itching and antihistamine properties. If fever, pain, drainage or crusting of the lip develops then come back as these are different scenarios.

## 2022-02-19 NOTE — ED Provider Notes (Signed)
[facility]  Note:  This document was prepared using Systems analyst and may include unintentional dictation errors.  MRN: [id number] DOB: [birth year]  Subjective:   Frederick Reynolds is a 27 y.o. male presenting for 4 day history of acute onset intermittent facial swelling, left lower lip swelling.  The only new exposure that the patient can think of that is that they have a new puppy at home and he has a lot of coffee to like his mouth and face.  Otherwise, denies eating any new foods, starting new medications, exposure to poisonous plants, new hygiene products, new cleaning products or detergents.  No fever, pain, drainage of pus or bleeding.  His wife developed a spot as well in her left and is similar to what he has.  No concern for herpes simplex as neither 1 has had a cold sore ever.  No current facility-administered medications for this encounter.  Current Outpatient Medications:    ALBUTEROL IN, Inhale 2 puffs into the lungs., Disp: , Rfl:    Fluticasone-Umeclidin-Vilant (TRELEGY ELLIPTA) 200-62.5-25 MCG/INH AEPB, Inhale 1 puff into the lungs daily., Disp: 14 each, Rfl: 0   meclizine (ANTIVERT) 25 MG tablet, Take 1 tablet (25 mg total) by mouth 2 (two) times daily as needed for dizziness or nausea. (Patient not taking: Reported on 10/09/2020), Disp: 9 tablet, Rfl: 0   No Known Allergies  Past Medical History:  Diagnosis Date   Asthma    Irregular heart beat      Past Surgical History:  Procedure Laterality Date   retina rupture       Family History  Problem Relation Age of Onset   Hypertension Father    Healthy Mother     Social History   Tobacco Use   Smoking status: Some Days    Types: Cigars   Smokeless tobacco: Never  Vaping Use   Vaping Use: Never used  Substance Use Topics   Alcohol use: Yes    Comment: occasionally    Drug use: Never    ROS   Objective:   Vitals: BP 133/82 (BP Location: Right Arm)   Pulse  80   Temp 99.2 F (37.3 C) (Oral)   Resp 16   SpO2 98%   Physical Exam Constitutional:      General: He is not in acute distress.    Appearance: Normal appearance. He is well-developed and normal weight. He is not ill-appearing, toxic-appearing or diaphoretic.  HENT:     Head: Normocephalic and atraumatic.     Right Ear: Tympanic membrane, ear canal and external ear normal. No drainage, swelling or tenderness. No middle ear effusion. There is no impacted cerumen. Tympanic membrane is not erythematous or bulging.     Left Ear: Tympanic membrane, ear canal and external ear normal. No drainage, swelling or tenderness.  No middle ear effusion. There is no impacted cerumen. Tympanic membrane is not erythematous or bulging.     Nose: Nose normal. No congestion or rhinorrhea.     Mouth/Throat:     Mouth: Mucous membranes are moist.     Pharynx: No oropharyngeal exudate or posterior oropharyngeal erythema.   Eyes:     General: No scleral icterus.       Right eye: No discharge.        Left eye: No discharge.     Extraocular Movements: Extraocular movements intact.     Conjunctiva/sclera: Conjunctivae normal.  Cardiovascular:     Rate and Rhythm: Normal  rate.  Pulmonary:     Effort: Pulmonary effort is normal.  Musculoskeletal:     Cervical back: Normal range of motion and neck supple. No rigidity. No muscular tenderness.  Neurological:     General: No focal deficit present.     Mental Status: He is alert and oriented to person, place, and time.  Psychiatric:        Mood and Affect: Mood normal.        Behavior: Behavior normal.        Thought Content: Thought content normal.        Judgment: Judgment normal.     Assessment and Plan :   PDMP not reviewed this encounter.  1. Lip swelling   2. Facial swelling     Discussed possibility of an infectious process versus an allergic reaction.  We collectively agreed to manage for an allergic reaction and avoid any new exposures.   Will use prednisone and hydroxyzine.  Counseled patient on potential for adverse effects with medications prescribed/recommended today, ER and return-to-clinic precautions discussed, patient verbalized understanding.    Jaynee Eagles, PA-C 02/19/22 1056

## 2022-02-19 NOTE — ED Triage Notes (Signed)
Pt c/o swelling to face and bottom lip x 4 days-last dose benadryl last night-NAD-steady gait

## 2022-02-20 ENCOUNTER — Ambulatory Visit (INDEPENDENT_AMBULATORY_CARE_PROVIDER_SITE_OTHER): Payer: BC Managed Care – PPO

## 2022-02-20 ENCOUNTER — Ambulatory Visit
Admission: EM | Admit: 2022-02-20 | Discharge: 2022-02-20 | Disposition: A | Discharge status: Home / Self Care | Payer: BC Managed Care – PPO | Attending: Emergency Medicine | Admitting: Emergency Medicine

## 2022-02-20 ENCOUNTER — Ambulatory Visit
Admission: EM | Admit: 2022-02-20 | Discharge: 2022-02-20 | Disposition: A | Discharge status: Home / Self Care | Payer: BC Managed Care – PPO

## 2022-02-20 VITALS — BP 132/81 | HR 73 | Temp 98.8°F | Resp 12

## 2022-02-20 DIAGNOSIS — S0990XA Unspecified injury of head, initial encounter: Secondary | ICD-10-CM | POA: Diagnosis not present

## 2022-02-20 MED ORDER — ONDANSETRON 4 MG PO TBDP: 4.0000 mg | ORAL_TABLET | Freq: Three times a day (TID) | ORAL | 0 refills | Status: AC | PRN

## 2022-02-20 MED ORDER — ONDANSETRON 4 MG PO TBDP
4.0000 mg | ORAL_TABLET | Freq: Once | ORAL | Status: AC
  Administered 2022-02-20: 4 mg via ORAL

## 2022-02-20 NOTE — ED Provider Notes (Signed)
[facility]    CSN: [id number] Arrival date & time: 02/20/22  0946    HISTORY   Chief Complaint  Patient presents with   Head Injury   HPI Frederick Reynolds is a pleasant, 27 y.o. male who presents to urgent care today. Patient complains of hitting his dumbbell yesterday, states he was in a seated position and rolled back to lie on his back not realizing there was a dumbbell behind him.  Patient states that his head struck the dumbbell at the base of the back of his head.  Patient states he immediately began to feel intense nausea this and pressure in his head when this occurred.  Patient denies loss of consciousness, vision changes, tinnitus.  Patient states he has not tried thing due to his nausea.  Reports still having headache at this time.  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    The history is provided by the patient.   Past Medical History:  Diagnosis Date   Asthma    Irregular heart beat    Patient Active Problem List   Diagnosis Date Noted   Chronic bronchitis ([place]) 10/09/2020   Past Surgical History:  Procedure Laterality Date   retina rupture       Home Medications    Prior to Admission medications   Medication Sig Start Date End Date Taking? Authorizing Provider  ALBUTEROL IN Inhale 2 puffs into the lungs.    [provider]  Fluticasone-Umeclidin-Vilant (TRELEGY ELLIPTA) 200-62.5-25 MCG/INH AEPB Inhale 1 puff into the lungs daily. 10/09/20   Margaretha Seeds, MD  hydrOXYzine (ATARAX) 25 MG tablet Take 0.5-1 tablets (12.5-25 mg total) by mouth every 8 (eight) hours as needed for itching. 02/19/22   Jaynee Eagles, PA-C  meclizine (ANTIVERT) 25 MG tablet Take 1 tablet (25 mg total) by mouth 2 (two) times daily as needed for dizziness or nausea. Patient not taking: Reported on 10/09/2020 10/25/19   Noe Gens, PA-C  predniSONE (DELTASONE) 20 MG tablet Take 2 tablets daily with breakfast. 02/19/22   Jaynee Eagles, PA-C    Family  History Family History  Problem Relation Age of Onset   Hypertension Father    Healthy Mother    Social History Social History   Tobacco Use   Smoking status: Some Days    Types: Cigars   Smokeless tobacco: Never  Vaping Use   Vaping Use: Never used  Substance Use Topics   Alcohol use: Yes    Comment: occasionally    Drug use: Never   Allergies   Patient has no known allergies.  Review of Systems Review of Systems Pertinent findings revealed after performing a 14 point review of systems has been noted in the history of present illness.  Physical Exam Vital Signs BP 132/81 (BP Location: Left Arm)   Pulse 73   Temp 98.8 F (37.1 C) (Oral)   Resp 12   SpO2 100%   No data found.  Physical Exam Eyes:     General: No visual field deficit. Neurological:     General: No focal deficit present.     Mental Status: He is alert and oriented to person, place, and time.     Cranial Nerves: Cranial nerves 2-12 are intact. No cranial nerve deficit, dysarthria or facial asymmetry.     Sensory: Sensation is intact.     Motor: Motor function is intact.     Coordination: Coordination is intact.     Gait: Gait is  intact.     Deep Tendon Reflexes: Reflexes are normal and symmetric.     Visual Acuity Right Eye Distance:   Left Eye Distance:   Bilateral Distance:    Right Eye Near:   Left Eye Near:    Bilateral Near:     [facility] Couse / Diagnostics / Procedures:     Radiology CT Head Wo Contrast  Result Date: 02/20/2022 CLINICAL DATA:  Head trauma, repeat vomiting. EXAM: CT HEAD WITHOUT CONTRAST TECHNIQUE: Contiguous axial images were obtained from the base of the skull through the vertex without intravenous contrast. RADIATION DOSE REDUCTION: This exam was performed according to the departmental dose-optimization program which includes automated exposure control, adjustment of the mA and/or kV according to patient size and/or use of iterative reconstruction technique.  COMPARISON:  Head CT 10/25/2019. FINDINGS: Brain: No acute hemorrhage, mass effect or midline shift. Gray-white differentiation is preserved. No hydrocephalus. No extra-axial collection. Basilar cisterns are patent. Vascular: No hyperdense vessel or unexpected calcification. Skull: No calvarial fracture or suspicious bone lesion. Skull base is unremarkable. Sinuses/Orbits: Unremarkable. Other: No scalp hematoma. IMPRESSION: No evidence of acute intracranial injury. Electronically Signed   By: Emmit Alexanders M.D.   On: 02/20/2022 10:27    Procedures Procedures (including critical care time) EKG  Pending results:  Labs Reviewed - No data to display  Medications Ordered in UC: Medications  ondansetron (ZOFRAN-ODT) disintegrating tablet 4 mg (4 mg Oral Given 02/20/22 0958)    UC Diagnoses / Final Clinical Impressions(s)   I have reviewed the triage vital signs and the nursing notes.  Pertinent labs & imaging results that were available during my care of the patient were reviewed by me and considered in my medical decision making (see chart for details).    Final diagnoses:  Minor head injury, initial encounter   Patient advised of CT scan results.  Brain rest recommended for the next week to 10 days.  Zofran provided for nausea.  Return precautions advised.  Please see discharge instructions below for details of plan of care as provided to patient. ED Prescriptions     Medication Sig Dispense Auth. Provider   ondansetron (ZOFRAN-ODT) 4 MG disintegrating tablet Take 1 tablet (4 mg total) by mouth every 8 (eight) hours as needed for nausea or vomiting. 20 tablet Lynden Oxford Scales, PA-C      PDMP not reviewed this encounter.  Pending results:  Labs Reviewed - No data to display  Discharge Instructions:   Discharge Instructions      The CT scan of your head did not reveal any acute trauma or concussion.  I do still recommend that you take a few days to rest and avoid reading,  watching television, video games, focusing on any type of computer screen and phone.  You are welcome to take ibuprofen for pain.  I provided you with a prescription for Zofran for your nausea.  Please follow-up with your primary care provider if your symptoms do not resolve in the next week to 10 days.      Disposition Upon Discharge:  Condition: stable for discharge home  Patient presented with an acute illness with associated systemic symptoms and significant discomfort requiring urgent management. In my opinion, this is a condition that a prudent lay person (someone who possesses an average knowledge of health and medicine) may potentially expect to result in complications if not addressed urgently such as respiratory distress, impairment of bodily function or dysfunction of bodily organs.   Routine symptom specific,  illness specific and/or disease specific instructions were discussed with the patient and/or caregiver at length.   As such, the patient has been evaluated and assessed, work-up was performed and treatment was provided in alignment with urgent care protocols and evidence based medicine.  Patient/parent/caregiver has been advised that the patient may require follow up for further testing and treatment if the symptoms continue in spite of treatment, as clinically indicated and appropriate.  Patient/parent/caregiver has been advised to return to the [facility] or PCP if no better; to PCP or the Emergency Department if new signs and symptoms develop, or if the current signs or symptoms continue to change or worsen for further workup, evaluation and treatment as clinically indicated and appropriate  The patient will follow up with their current PCP if and as advised. If the patient does not currently have a PCP we will assist them in obtaining one.   The patient may need specialty follow up if the symptoms continue, in spite of conservative treatment and management, for further workup,  evaluation, consultation and treatment as clinically indicated and appropriate.  Patient/parent/caregiver verbalized understanding and agreement of plan as discussed.  All questions were addressed during visit.  Please see discharge instructions below for further details of plan.  This office note has been dictated using Museum/gallery curator.  Unfortunately, this method of dictation can sometimes lead to typographical or grammatical errors.  I apologize for your inconvenience in advance if this occurs.  Please do not hesitate to reach out to me if clarification is needed.      Lynden Oxford Scales, PA-C 02/20/22 1037

## 2022-02-20 NOTE — ED Triage Notes (Signed)
Pt presents with c/o hitting his head on a dumbbell yesterday. Pt endorses nausea and head pressure. Pt denies LOC after hitting his head.

## 2022-02-20 NOTE — ED Triage Notes (Signed)
The pt states yesterday while working out he fell back on a dumbbell (hit his head). The patient c/o head pressure, intermittent dizziness, nausea, and vomiting.

## 2022-02-20 NOTE — Discharge Instructions (Signed)
The CT scan of your head did not reveal any acute trauma or concussion.  I do still recommend that you take a few days to rest and avoid reading, watching television, video games, focusing on any type of computer screen and phone.  You are welcome to take ibuprofen for pain.  I provided you with a prescription for Zofran for your nausea.  Please follow-up with your primary care provider if your symptoms do not resolve in the next week to 10 days.

## 2022-03-06 ENCOUNTER — Ambulatory Visit
Admission: EM | Admit: 2022-03-06 | Discharge: 2022-03-06 | Disposition: A | Discharge status: Home / Self Care | Payer: BC Managed Care – PPO | Attending: Nurse Practitioner | Admitting: Nurse Practitioner

## 2022-03-06 DIAGNOSIS — K130 Diseases of lips: Secondary | ICD-10-CM

## 2022-03-06 DIAGNOSIS — R220 Localized swelling, mass and lump, head: Secondary | ICD-10-CM

## 2022-03-06 MED ORDER — AMOXICILLIN-POT CLAVULANATE 875-125 MG PO TABS: 1.0000 | ORAL_TABLET | Freq: Two times a day (BID) | ORAL | 0 refills | Status: AC

## 2022-03-06 NOTE — ED Triage Notes (Signed)
Pt presents with bump on left side of bottom lip. States the bump comes and goes, this time it has worsened.

## 2022-03-06 NOTE — ED Provider Notes (Signed)
[facility]    CSN: [id number] Arrival date & time: 03/06/22  1413      History   Chief Complaint Chief Complaint  Patient presents with   Oral Swelling    HPI Frederick Reynolds is a 27 y.o. male presents for evaluation of a lip lesion/lip swelling.  Patient was seen in urgent care on 2/13 for lip swelling/lesion.  He declined HSV testing at that time and was started on prednisone and hydroxyzine.  He states he never picked up the hydroxyzine and did not start the prednisone as the lesions seem to nearly resolve on its own.  A few days ago he noticed that began to worsen again.  Today he was using some type of cold stone over it which caused it to bleed.  He states it is a little sensitive but otherwise causes him no pain.  No drainage.  No fevers or chills.  Denies history of HSV.  He did start the prednisone 3 days ago and does not feel he has had any improvement.  No other concerns at this time.  HPI  Past Medical History:  Diagnosis Date   Asthma    Irregular heart beat     Patient Active Problem List   Diagnosis Date Noted   Chronic bronchitis ([place]) 10/09/2020    Past Surgical History:  Procedure Laterality Date   retina rupture          Home Medications    Prior to Admission medications   Medication Sig Start Date End Date Taking? Authorizing Provider  amoxicillin-clavulanate (AUGMENTIN) 875-125 MG tablet Take 1 tablet by mouth every 12 (twelve) hours for 7 days. 03/06/22 03/13/22 Yes Melynda Ripple, NP  ALBUTEROL IN Inhale 2 puffs into the lungs.    [provider]  Fluticasone-Umeclidin-Vilant (TRELEGY ELLIPTA) 200-62.5-25 MCG/INH AEPB Inhale 1 puff into the lungs daily. 10/09/20   Margaretha Seeds, MD  hydrOXYzine (ATARAX) 25 MG tablet Take 0.5-1 tablets (12.5-25 mg total) by mouth every 8 (eight) hours as needed for itching. 02/19/22   Jaynee Eagles, PA-C  meclizine (ANTIVERT) 25 MG tablet Take 1 tablet (25 mg total) by mouth 2 (two) times daily as  needed for dizziness or nausea. Patient not taking: Reported on 10/09/2020 10/25/19   Noe Gens, PA-C  ondansetron (ZOFRAN-ODT) 4 MG disintegrating tablet Take 1 tablet (4 mg total) by mouth every 8 (eight) hours as needed for nausea or vomiting. 02/20/22   Lynden Oxford Scales, PA-C  predniSONE (DELTASONE) 20 MG tablet Take 2 tablets daily with breakfast. 02/19/22   Jaynee Eagles, PA-C    Family History Family History  Problem Relation Age of Onset   Hypertension Father    Healthy Mother     Social History Social History   Tobacco Use   Smoking status: Some Days    Types: Cigars   Smokeless tobacco: Never  Vaping Use   Vaping Use: Never used  Substance Use Topics   Alcohol use: Yes    Comment: occasionally    Drug use: Never     Allergies   Patient has no known allergies.   Review of Systems Review of Systems  HENT:         Lip lesion/swelling     Physical Exam Triage Vital Signs ED Triage Vitals  Enc Vitals Group     BP 03/06/22 1425 122/78     Pulse Rate 03/06/22 1425 97     Resp 03/06/22 1425 14  Temp 03/06/22 1425 98.4 F (36.9 C)     Temp Source 03/06/22 1425 Oral     SpO2 03/06/22 1425 92 %     Weight --      Height --      Head Circumference --      Peak Flow --      Pain Score 03/06/22 1424 2     Pain Loc --      Pain Edu? --      Excl. in [place]? --    No data found.  Updated Vital Signs BP 122/78 (BP Location: Right Arm)   Pulse 97   Temp 98.4 F (36.9 C) (Oral)   Resp 14   SpO2 92%   Visual Acuity Right Eye Distance:   Left Eye Distance:   Bilateral Distance:    Right Eye Near:   Left Eye Near:    Bilateral Near:     Physical Exam Vitals and nursing note reviewed.  Constitutional:      Appearance: Normal appearance.  HENT:     Head: Normocephalic and atraumatic.     Mouth/Throat:     Lips: Pink.      Comments: Moderate swelling of the left lower lip with a singular raised lesion/ulceration.  No drainage.  No  vesicles.  No facial swelling. Eyes:     Pupils: Pupils are equal, round, and reactive to light.  Cardiovascular:     Rate and Rhythm: Normal rate.  Pulmonary:     Effort: Pulmonary effort is normal.  Skin:    General: Skin is warm and dry.  Neurological:     General: No focal deficit present.     Mental Status: He is alert and oriented to person, place, and time.  Psychiatric:        Mood and Affect: Mood normal.        Behavior: Behavior normal.      UC Treatments / Results  Labs (all labs ordered are listed, but only abnormal results are displayed) Labs Reviewed  HSV CULTURE AND TYPING    EKG   Radiology No results found.  Procedures Procedures (including critical care time)  Medications Ordered in UC Medications - No data to display  Initial Impression / Assessment and Plan / UC Course  I have reviewed the triage vital signs and the nursing notes.  Pertinent labs & imaging results that were available during my care of the patient were reviewed by me and considered in my medical decision making (see chart for details).     Reviewed exam and symptoms with patient. Will send out HSV screening and contact if positive Concern for developing infection, start Augmentin Advised to follow-up with PCP if symptoms do not improve ER precautions reviewed and patient verbalized understanding Final Clinical Impressions(s) / UC Diagnoses   Final diagnoses:  Lip lesion  Lip swelling     Discharge Instructions      Start Augmentin twice daily for 5 days The clinical contact you with the results of the swab taken today is positive Follow-up with your PCP if symptoms or not improving Please go to the emergency room if you have any worsening symptoms   ED Prescriptions     Medication Sig Dispense Auth. Provider   amoxicillin-clavulanate (AUGMENTIN) 875-125 MG tablet Take 1 tablet by mouth every 12 (twelve) hours for 7 days. 14 tablet Melynda Ripple, NP       PDMP not reviewed this encounter.   Melynda Ripple, NP  03/06/22 1445  

## 2022-03-06 NOTE — Discharge Instructions (Signed)
Start Augmentin twice daily for 5 days The clinical contact you with the results of the swab taken today is positive Follow-up with your PCP if symptoms or not improving Please go to the emergency room if you have any worsening symptoms

## 2022-03-10 LAB — HSV CULTURE AND TYPING

## 2022-03-18 ENCOUNTER — Ambulatory Visit (HOSPITAL_BASED_OUTPATIENT_CLINIC_OR_DEPARTMENT_OTHER): Payer: No Typology Code available for payment source | Admitting: Pulmonary Disease

## 2022-03-18 ENCOUNTER — Encounter (HOSPITAL_BASED_OUTPATIENT_CLINIC_OR_DEPARTMENT_OTHER): Payer: Self-pay

## 2022-06-13 ENCOUNTER — Ambulatory Visit
Admission: EM | Admit: 2022-06-13 | Discharge: 2022-06-13 | Disposition: A | Discharge status: Home / Self Care | Payer: BC Managed Care – PPO | Attending: Nurse Practitioner | Admitting: Nurse Practitioner

## 2022-06-13 DIAGNOSIS — B349 Viral infection, unspecified: Secondary | ICD-10-CM | POA: Insufficient documentation

## 2022-06-13 DIAGNOSIS — R051 Acute cough: Secondary | ICD-10-CM | POA: Diagnosis not present

## 2022-06-13 DIAGNOSIS — Z1152 Encounter for screening for COVID-19: Secondary | ICD-10-CM | POA: Diagnosis not present

## 2022-06-13 DIAGNOSIS — F17290 Nicotine dependence, other tobacco product, uncomplicated: Secondary | ICD-10-CM | POA: Diagnosis not present

## 2022-06-13 DIAGNOSIS — R42 Dizziness and giddiness: Secondary | ICD-10-CM | POA: Diagnosis not present

## 2022-06-13 DIAGNOSIS — J4489 Other specified chronic obstructive pulmonary disease: Secondary | ICD-10-CM | POA: Diagnosis not present

## 2022-06-13 DIAGNOSIS — R0981 Nasal congestion: Secondary | ICD-10-CM | POA: Diagnosis present

## 2022-06-13 LAB — POCT INFLUENZA A/B
Influenza A, POC: NEGATIVE
Influenza B, POC: NEGATIVE

## 2022-06-13 NOTE — ED Provider Notes (Signed)
[facility]    CSN: [id number] Arrival date & time: 06/13/22  1244      History   Chief Complaint No chief complaint on file.   HPI Frederick Reynolds is a 27 y.o. male  presents for evaluation of URI symptoms for 3-4 days. Patient reports associated symptoms of scratchy throat, nasal congestion, cough, fatigue, dizziness. Denies N/V/D, fevers, sore throat, body aches, shortness of breath. Patient does not have a hx of asthma.  He does smoke cigars.  Reports sick contacts via his [employer] study group.  Pt has taken Mucinex OTC for symptoms.  Patient is requesting flu testing.  Pt has no other concerns at this time.   HPI  Past Medical History:  Diagnosis Date   Asthma    Irregular heart beat     Patient Active Problem List   Diagnosis Date Noted   Chronic bronchitis ([facility]) 10/09/2020    Past Surgical History:  Procedure Laterality Date   retina rupture          Home Medications    Prior to Admission medications   Medication Sig Start Date End Date Taking? Authorizing Provider  ALBUTEROL IN Inhale 2 puffs into the lungs.    [provider]  Fluticasone-Umeclidin-Vilant (TRELEGY ELLIPTA) 200-62.5-25 MCG/INH AEPB Inhale 1 puff into the lungs daily. 10/09/20   Luciano Cutter, MD  hydrOXYzine (ATARAX) 25 MG tablet Take 0.5-1 tablets (12.5-25 mg total) by mouth every 8 (eight) hours as needed for itching. 02/19/22   Wallis Bamberg, PA-C  meclizine (ANTIVERT) 25 MG tablet Take 1 tablet (25 mg total) by mouth 2 (two) times daily as needed for dizziness or nausea. Patient not taking: Reported on 10/09/2020 10/25/19   Lurene Shadow, PA-C  ondansetron (ZOFRAN-ODT) 4 MG disintegrating tablet Take 1 tablet (4 mg total) by mouth every 8 (eight) hours as needed for nausea or vomiting. 02/20/22   Theadora Rama Scales, PA-C  predniSONE (DELTASONE) 20 MG tablet Take 2 tablets daily with breakfast. 02/19/22   Wallis Bamberg, PA-C    Family History Family History  Problem  Relation Age of Onset   Hypertension Father    Healthy Mother     Social History Social History   Tobacco Use   Smoking status: Some Days    Types: Cigars   Smokeless tobacco: Never  Vaping Use   Vaping Use: Never used  Substance Use Topics   Alcohol use: Yes    Comment: occasionally    Drug use: Never     Allergies   Patient has no known allergies.   Review of Systems Review of Systems  Constitutional:  Positive for fatigue.  HENT:  Positive for congestion.   Respiratory:  Positive for cough.      Physical Exam Triage Vital Signs ED Triage Vitals  Enc Vitals Group     BP 06/13/22 1314 120/80     Pulse Rate 06/13/22 1314 (!) 59     Resp 06/13/22 1314 16     Temp 06/13/22 1314 98.8 F (37.1 C)     Temp Source 06/13/22 1314 Oral     SpO2 06/13/22 1314 96 %     Weight --      Height --      Head Circumference --      Peak Flow --      Pain Score 06/13/22 1316 0     Pain Loc --      Pain Edu? --      Excl.  in [place]? --    No data found.  Updated Vital Signs BP 120/80 (BP Location: Right Arm)   Pulse (!) 59   Temp 98.8 F (37.1 C) (Oral)   Resp 16   SpO2 96%   Visual Acuity Right Eye Distance:   Left Eye Distance:   Bilateral Distance:    Right Eye Near:   Left Eye Near:    Bilateral Near:     Physical Exam Vitals and nursing note reviewed.  Constitutional:      General: He is not in acute distress.    Appearance: Normal appearance. He is not ill-appearing or toxic-appearing.  HENT:     Head: Normocephalic and atraumatic.     Right Ear: Tympanic membrane and ear canal normal.     Left Ear: Tympanic membrane and ear canal normal.     Nose: Congestion present.     Mouth/Throat:     Mouth: Mucous membranes are moist.     Pharynx: No oropharyngeal exudate or posterior oropharyngeal erythema.  Eyes:     Pupils: Pupils are equal, round, and reactive to light.  Cardiovascular:     Rate and Rhythm: Normal rate and regular rhythm.     Heart  sounds: Normal heart sounds.  Pulmonary:     Effort: Pulmonary effort is normal.     Breath sounds: Normal breath sounds.  Musculoskeletal:     Cervical back: Normal range of motion and neck supple.  Lymphadenopathy:     Cervical: No cervical adenopathy.  Skin:    General: Skin is warm and dry.  Neurological:     General: No focal deficit present.     Mental Status: He is alert and oriented to person, place, and time.  Psychiatric:        Mood and Affect: Mood normal.        Behavior: Behavior normal.      UC Treatments / Results  Labs (all labs ordered are listed, but only abnormal results are displayed) Labs Reviewed  SARS CORONAVIRUS 2 (TAT 6-24 HRS)  POCT INFLUENZA A/B    EKG   Radiology No results found.  Procedures Procedures (including critical care time)  Medications Ordered in UC Medications - No data to display  Initial Impression / Assessment and Plan / UC Course  I have reviewed the triage vital signs and the nursing notes.  Pertinent labs & imaging results that were available during my care of the patient were reviewed by me and considered in my medical decision making (see chart for details).     Negative rapid flu.  COVID PCR and will contact if positive Discussed viral illness and symptomatic treatment PCP follow-up if symptoms do not improve ER precautions reviewed and patient verbalized understanding Final Clinical Impressions(s) / UC Diagnoses   Final diagnoses:  Acute cough  Viral illness     Discharge Instructions      The clinic will contact you with results of your COVID testing is positive Rest and fluids Follow-up with your PCP if your symptoms do not improve Please go to the ER for any worsening symptoms I hope you feel better soon!     ED Prescriptions   None    PDMP not reviewed this encounter.   Radford Pax, NP 06/13/22 1406

## 2022-06-13 NOTE — ED Triage Notes (Signed)
Pt presents to UC w/ c/o itchy throat, nasal congestion, weakness, fatigue, and dizziness x3-4 days. Has not taken any medications to help with symptoms. States he was swimming a lot last week in the ocean.

## 2022-06-13 NOTE — Discharge Instructions (Signed)
The clinic will contact you with results of your COVID testing is positive Rest and fluids Follow-up with your PCP if your symptoms do not improve Please go to the ER for any worsening symptoms I hope you feel better soon!

## 2022-06-14 LAB — SARS CORONAVIRUS 2 (TAT 6-24 HRS): SARS Coronavirus 2: NEGATIVE

## 2022-08-15 ENCOUNTER — Ambulatory Visit
Admission: EM | Admit: 2022-08-15 | Discharge: 2022-08-15 | Disposition: A | Discharge status: Home / Self Care | Payer: BC Managed Care – PPO | Attending: Internal Medicine | Admitting: Internal Medicine

## 2022-08-15 DIAGNOSIS — S01511A Laceration without foreign body of lip, initial encounter: Secondary | ICD-10-CM

## 2022-08-15 MED ORDER — AMOXICILLIN-POT CLAVULANATE 875-125 MG PO TABS: 1.0000 | ORAL_TABLET | Freq: Two times a day (BID) | ORAL | 0 refills | Status: AC

## 2022-08-15 MED ORDER — NAPROXEN 375 MG PO TABS: 375.0000 mg | ORAL_TABLET | Freq: Two times a day (BID) | ORAL | 0 refills | Status: DC

## 2022-08-15 MED ORDER — AMOXICILLIN-POT CLAVULANATE 875-125 MG PO TABS: 1.0000 | ORAL_TABLET | Freq: Two times a day (BID) | ORAL | 0 refills | Status: DC

## 2022-08-15 MED ORDER — NAPROXEN 375 MG PO TABS: 375.0000 mg | ORAL_TABLET | Freq: Two times a day (BID) | ORAL | 0 refills | Status: AC

## 2022-08-15 NOTE — Discharge Instructions (Addendum)
Take antibiotic as prescribed. Take naproxen to help with pain and swelling as needed every 12 hours, this medicine is similar to ibuprofen so do not take ibuprofen when taking naproxen.  Salt water rinses.  This will heal quickly since it is on the mouth.  Return if you notice any signs of infection (redness, swelling, pus, pain).  If you develop any new or worsening symptoms or if your symptoms do not start to improve, please return here or follow-up with your primary care provider. If your symptoms are severe, please go to the emergency room.=
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
## 2022-08-15 NOTE — ED Triage Notes (Addendum)
Pt with inner lower lip laceration during martial arts ~730p-NAD-steady gait

## 2023-02-16 ENCOUNTER — Emergency Department (HOSPITAL_COMMUNITY)
Admission: EM | Admit: 2023-02-16 | Discharge: 2023-02-16 | Disposition: A | Discharge status: Home / Self Care | Payer: PRIVATE HEALTH INSURANCE | Attending: Emergency Medicine | Admitting: Emergency Medicine

## 2023-02-16 ENCOUNTER — Emergency Department (HOSPITAL_COMMUNITY): Payer: PRIVATE HEALTH INSURANCE

## 2023-02-16 ENCOUNTER — Ambulatory Visit (INDEPENDENT_AMBULATORY_CARE_PROVIDER_SITE_OTHER): Payer: PRIVATE HEALTH INSURANCE

## 2023-02-16 ENCOUNTER — Ambulatory Visit
Admission: EM | Admit: 2023-02-16 | Discharge: 2023-02-16 | Disposition: A | Discharge status: Home / Self Care | Payer: PRIVATE HEALTH INSURANCE | Attending: Family Medicine | Admitting: Family Medicine

## 2023-02-16 ENCOUNTER — Telehealth: Payer: Self-pay | Admitting: Pulmonary Disease

## 2023-02-16 ENCOUNTER — Other Ambulatory Visit: Payer: Self-pay

## 2023-02-16 DIAGNOSIS — J95811 Postprocedural pneumothorax: Secondary | ICD-10-CM

## 2023-02-16 DIAGNOSIS — J45909 Unspecified asthma, uncomplicated: Secondary | ICD-10-CM | POA: Insufficient documentation

## 2023-02-16 DIAGNOSIS — R0602 Shortness of breath: Secondary | ICD-10-CM | POA: Diagnosis present

## 2023-02-16 DIAGNOSIS — J939 Pneumothorax, unspecified: Secondary | ICD-10-CM

## 2023-02-16 DIAGNOSIS — J930 Spontaneous tension pneumothorax: Secondary | ICD-10-CM | POA: Diagnosis not present

## 2023-02-16 NOTE — ED Provider Notes (Signed)
 [facility]    CSN: [id number] Arrival date & time: 02/16/23  9085      History   Chief Complaint Chief Complaint  Patient presents with   Shortness of Breath    HPI Frederick Reynolds is a 28 y.o. male presents for shortness of breath.  Patient states 2 days ago he had acupuncture which she has had done many times before.  States he normally feels a little sore the first day or so with it normally goes away but this time and has persisted.  States he feels like there is air trapped in his chest.  Denies any cough or congestion.  Denies history of asthma.  But chart review does show that is listed.  Denies smoking history.  No other concerns at this time.   Shortness of Breath   Past Medical History:  Diagnosis Date   Asthma    Irregular heart beat     Patient Active Problem List   Diagnosis Date Noted   Chronic bronchitis (HCC) 10/09/2020    Past Surgical History:  Procedure Laterality Date   retina rupture          Home Medications    Prior to Admission medications   Medication Sig Start Date End Date Taking? Authorizing Provider  ALBUTEROL  IN Inhale 2 puffs into the lungs.    [provider]  amoxicillin -clavulanate (AUGMENTIN ) 875-125 MG tablet Take 1 tablet by mouth every 12 (twelve) hours. 08/15/22   Enedelia Dorna HERO, FNP  Fluticasone -Umeclidin-Vilant (TRELEGY ELLIPTA ) 200-62.5-25 MCG/INH AEPB Inhale 1 puff into the lungs daily. 10/09/20   Kassie Acquanetta Bradley, MD  hydrOXYzine  (ATARAX ) 25 MG tablet Take 0.5-1 tablets (12.5-25 mg total) by mouth every 8 (eight) hours as needed for itching. 02/19/22   Christopher Savannah, PA-C  meclizine  (ANTIVERT ) 25 MG tablet Take 1 tablet (25 mg total) by mouth 2 (two) times daily as needed for dizziness or nausea. Patient not taking: Reported on 10/09/2020 10/25/19   Anitra Rocky KIDD, PA-C  naproxen  (NAPROSYN ) 375 MG tablet Take 1 tablet (375 mg total) by mouth 2 (two) times daily. 08/15/22   Enedelia Dorna HERO, FNP   ondansetron  (ZOFRAN -ODT) 4 MG disintegrating tablet Take 1 tablet (4 mg total) by mouth every 8 (eight) hours as needed for nausea or vomiting. 02/20/22   Joesph Shaver Scales, PA-C  predniSONE  (DELTASONE ) 20 MG tablet Take 2 tablets daily with breakfast. 02/19/22   Christopher Savannah, PA-C    Family History Family History  Problem Relation Age of Onset   Hypertension Father    Healthy Mother     Social History Social History   Tobacco Use   Smoking status: Some Days    Types: Cigars   Smokeless tobacco: Never  Vaping Use   Vaping status: Never Used  Substance Use Topics   Alcohol use: Not Currently   Drug use: Never     Allergies   Loratadine   Review of Systems Review of Systems  Respiratory:  Positive for shortness of breath.      Physical Exam Triage Vital Signs ED Triage Vitals  Encounter Vitals Group     BP 02/16/23 0927 132/81     Systolic BP Percentile --      Diastolic BP Percentile --      Pulse Rate 02/16/23 0926 75     Resp 02/16/23 0926 16     Temp 02/16/23 0927 99.1 F (37.3 C)     Temp Source 02/16/23 0927 Oral  SpO2 02/16/23 0926 96 %     Weight --      Height --      Head Circumference --      Peak Flow --      Pain Score 02/16/23 0926 10     Pain Loc --      Pain Education --      Exclude from Growth Chart --    No data found.  Updated Vital Signs BP 132/81   Pulse 75   Temp 99.1 F (37.3 C) (Oral)   Resp 16   SpO2 96%   Visual Acuity Right Eye Distance:   Left Eye Distance:   Bilateral Distance:    Right Eye Near:   Left Eye Near:    Bilateral Near:     Physical Exam Vitals and nursing note reviewed.  Constitutional:      General: He is not in acute distress.    Appearance: Normal appearance. He is not ill-appearing.  HENT:     Head: Normocephalic and atraumatic.  Eyes:     Pupils: Pupils are equal, round, and reactive to light.  Cardiovascular:     Rate and Rhythm: Normal rate and regular rhythm.     Heart  sounds: Normal heart sounds.  Pulmonary:     Effort: Pulmonary effort is normal. No respiratory distress.     Breath sounds: Normal breath sounds. No stridor. No wheezing, rhonchi or rales.     Comments: No palpable subcu emphysema.  No tracheal deviation.  Patient speaking in complete sentences without issue. Chest:     Chest wall: No tenderness.  Skin:    General: Skin is warm and dry.  Neurological:     General: No focal deficit present.     Mental Status: He is alert and oriented to person, place, and time.  Psychiatric:        Mood and Affect: Mood normal.        Behavior: Behavior normal.      UC Treatments / Results  Labs (all labs ordered are listed, but only abnormal results are displayed) Labs Reviewed - No data to display  EKG   Radiology DG Chest 2 View Result Date: 02/16/2023 CLINICAL DATA:  Shortness of breath after acupuncture. EXAM: CHEST - 2 VIEW COMPARISON:  Chest x-ray dated October 09, 2020. FINDINGS: The heart size and mediastinal contours are within normal limits. Normal pulmonary vascularity. Trace bilateral apical pneumothoraces. No focal consolidation or pleural effusion. No acute osseous abnormality. IMPRESSION: 1. Trace bilateral apical pneumothoraces. Electronically Signed   By: Elsie ONEIDA Shoulder M.D.   On: 02/16/2023 10:08    Procedures Procedures (including critical care time)  Medications Ordered in UC Medications - No data to display  Initial Impression / Assessment and Plan / UC Course  I have reviewed the triage vital signs and the nursing notes.  Pertinent labs & imaging results that were available during my care of the patient were reviewed by me and considered in my medical decision making (see chart for details).     Reviewed exam and x-ray results with patient.  Bilateral trace apical pneumothoraces.  Patient is well-appearing and in no acute respiratory distress with stable vital signs.  Advised patient to go to the emergency room for  further evaluation/monitoring due to this finding.  He is in agreement with plan will go POV to the emergency room. Final Clinical Impressions(s) / UC Diagnoses   Final diagnoses:  Shortness of breath  Pneumothorax on left  Pneumothorax on right     Discharge Instructions      Please go to the for further evaluation of your trace apical pneumothorax      ED Prescriptions   None    PDMP not reviewed this encounter.   Loreda Myla SAUNDERS, NP 02/16/23 1021

## 2023-02-16 NOTE — ED Provider Triage Note (Signed)
 Emergency Medicine Provider Triage Evaluation Note  Frederick Reynolds , a 28 y.o. male  was evaluated in triage.  Pt complains of shortness of breath. Patient repots that he had acupuncture done on Thursday night and woke up Friday with shortness of breath.  He went to urgent care earlier today and was told to come here.  This chest x-ray showed bilateral trace pneumothoraces.   Review of Systems  Positive: Shortness of breath Negative: Chest pain, fever  Physical Exam  BP (!) 159/89 (BP Location: Left Arm)   Pulse 69   Temp 98.2 F (36.8 C) (Oral)   Resp 16   Ht 5' 7 (1.702 m)   Wt 81.6 kg   SpO2 100%   BMI 28.19 kg/m  Gen:   Awake, no distress   Resp:  Normal effort  MSK:   Moves extremities without difficulty  Other:    Medical Decision Making  Medically screening exam initiated at 11:18 AM.  Appropriate orders placed.  Jacquan Savas was informed that the remainder of the evaluation will be completed by another provider, this initial triage assessment does not replace that evaluation, and the importance of remaining in the ED until their evaluation is complete.   Waddell Sluder, PA-C 02/16/23 1122

## 2023-02-16 NOTE — Discharge Instructions (Signed)
 Please go to the for further evaluation of your trace apical pneumothorax

## 2023-02-16 NOTE — ED Provider Notes (Signed)
  EMERGENCY DEPARTMENT AT [facility] Provider Note   CSN: [id number] Arrival date & time: 02/16/23  1041     History  Chief Complaint  Patient presents with   Shortness of Breath   Chest Injury    Frederick Reynolds is a 28 y.o. male.  The history is provided by the patient and medical records. No language interpreter was used.  Shortness of Breath Severity:  Severe Onset quality:  Gradual Duration:  3 days Timing:  Constant Progression:  Worsening Chronicity:  New Context: not URI   Relieved by:  Nothing Worsened by:  Deep breathing Ineffective treatments:  None tried Associated symptoms: chest pain   Associated symptoms: no abdominal pain, no cough, no fever, no headaches, no sputum production and no wheezing        Home Medications Prior to Admission medications   Medication Sig Start Date End Date Taking? Authorizing Provider  ALBUTEROL  IN Inhale 2 puffs into the lungs.    [provider]  amoxicillin -clavulanate (AUGMENTIN ) 875-125 MG tablet Take 1 tablet by mouth every 12 (twelve) hours. 08/15/22   Enedelia Dorna HERO, FNP  Fluticasone -Umeclidin-Vilant (TRELEGY ELLIPTA ) 200-62.5-25 MCG/INH AEPB Inhale 1 puff into the lungs daily. 10/09/20   Kassie Acquanetta Bradley, MD  hydrOXYzine  (ATARAX ) 25 MG tablet Take 0.5-1 tablets (12.5-25 mg total) by mouth every 8 (eight) hours as needed for itching. 02/19/22   Jaydn Fincher Savannah, PA-C  meclizine  (ANTIVERT ) 25 MG tablet Take 1 tablet (25 mg total) by mouth 2 (two) times daily as needed for dizziness or nausea. Patient not taking: Reported on 10/09/2020 10/25/19   Anitra Rocky KIDD, PA-C  naproxen  (NAPROSYN ) 375 MG tablet Take 1 tablet (375 mg total) by mouth 2 (two) times daily. 08/15/22   Enedelia Dorna HERO, FNP  ondansetron  (ZOFRAN -ODT) 4 MG disintegrating tablet Take 1 tablet (4 mg total) by mouth every 8 (eight) hours as needed for nausea or vomiting. 02/20/22   Joesph Shaver Scales, PA-C  predniSONE   (DELTASONE ) 20 MG tablet Take 2 tablets daily with breakfast. 02/19/22   Davyd Podgorski Savannah, PA-C      Allergies    Patient has no active allergies.    Review of Systems   Review of Systems  Constitutional:  Negative for chills, fatigue and fever.  HENT:  Negative for congestion.   Respiratory:  Positive for chest tightness and shortness of breath. Negative for cough, sputum production and wheezing.   Cardiovascular:  Positive for chest pain. Negative for palpitations and leg swelling.  Gastrointestinal:  Negative for abdominal pain.  Genitourinary:  Negative for dysuria.  Musculoskeletal:  Negative for back pain.  Neurological:  Negative for headaches.  Psychiatric/Behavioral:  Negative for agitation.   All other systems reviewed and are negative.   Physical Exam Updated Vital Signs BP (!) 159/89 (BP Location: Left Arm)   Pulse 69   Temp 98.2 F (36.8 C) (Oral)   Resp 16   Ht 5' 7 (1.702 m)   Wt 81.6 kg   SpO2 100%   BMI 28.19 kg/m  Physical Exam Vitals and nursing note reviewed.  Constitutional:      General: He is not in acute distress.    Appearance: He is well-developed. He is not ill-appearing, toxic-appearing or diaphoretic.  HENT:     Head: Normocephalic and atraumatic.  Eyes:     Conjunctiva/sclera: Conjunctivae normal.  Cardiovascular:     Rate and Rhythm: Normal rate and regular rhythm.     Heart sounds: No murmur heard.  Pulmonary:     Effort: Pulmonary effort is normal. No tachypnea or respiratory distress.     Breath sounds: Normal breath sounds. No stridor. No decreased breath sounds, wheezing, rhonchi or rales.  Chest:     Chest wall: No tenderness.  Abdominal:     Palpations: Abdomen is soft.     Tenderness: There is no abdominal tenderness.  Musculoskeletal:        General: No swelling.     Cervical back: Neck supple.  Skin:    General: Skin is warm and dry.     Capillary Refill: Capillary refill takes less than 2 seconds.  Neurological:     Mental  Status: He is alert.  Psychiatric:        Mood and Affect: Mood normal.     ED Results / Procedures / Treatments   Labs (all labs ordered are listed, but only abnormal results are displayed) Labs Reviewed - No data to display  EKG None  Radiology DG Chest 2 View Result Date: 02/16/2023 CLINICAL DATA:  Follow-up bilateral pneumothoraces EXAM: CHEST - 2 VIEW COMPARISON:  Chest radiograph from earlier today. FINDINGS: Stable cardiomediastinal silhouette with normal heart size. Stable small bilateral apical pneumothoraces. No pleural effusion. Lungs appear clear, with no acute consolidative airspace disease and no pulmonary edema. IMPRESSION: Stable small bilateral apical pneumothoraces. Electronically Signed   By: Selinda DELENA Blue M.D.   On: 02/16/2023 11:51   DG Chest 2 View Result Date: 02/16/2023 CLINICAL DATA:  Shortness of breath after acupuncture. EXAM: CHEST - 2 VIEW COMPARISON:  Chest x-ray dated October 09, 2020. FINDINGS: The heart size and mediastinal contours are within normal limits. Normal pulmonary vascularity. Trace bilateral apical pneumothoraces. No focal consolidation or pleural effusion. No acute osseous abnormality. IMPRESSION: 1. Trace bilateral apical pneumothoraces. Electronically Signed   By: Elsie ONEIDA Shoulder M.D.   On: 02/16/2023 10:08    Procedures Procedures    Medications Ordered in ED Medications - No data to display  ED Course/ Medical Decision Making/ A&P                                 Medical Decision Making Amount and/or Complexity of Data Reviewed Radiology: ordered.    Frederick Reynolds is a 28 y.o. male with a past medical history significant for asthma who presents with shortness of breath, chest tightness, and x-ray showing bilateral pneumothoraces.  Patient reports that he is a longtime user of acupuncture and went to the same provider he has been to in the past but they use a longer needle.  He went on Thursday and he reports Friday morning, 2 days  ago, he started having some shortness of breath and chest tightness.  He said that it was not very bad on Friday and Saturday but this morning it worsened precipitously and his pain was up to a 9 and 10 out of 10 in severity.  He went to urgent care and had an x-ray and was found to have bilateral apical pneumothoraces.  He was transferred here for evaluation.  Patient says that he is still having the pain but he does not want any pain medicine.  He has reassuring vital signs without tachycardia, tachypnea or hypoxia.  He is denying any anterior chest pain abdominal pain and denies any syncope.  Denies any other complaints.  On exam, lungs had clear breath sounds bilaterally and they were symmetric.  Chest and back nontender.  Neck nontender.  I did not appreciate any crepitance on my exam.  Good pulses in extremities.  Patient resting comfortably and has a normal gait.  X-ray that was repeated here appears stable with bilateral pneumothoraces.  No evidence of tension.  No other findings on the x-ray.  Due to the bilateral nature of his injury and his symptoms worsening today, I called and spoke to pulmonology with Dr. Annella who reviewed the case.  He said that we could to discharge patient or admit for observation however patient preferred to go home.  They do not feel CT would add any benefit right now given the patient's apparent stability and reassuring vital signs.  Patient was again offered admission but he would like to go home with good strict return precautions for for worsening symptoms and with plans to follow-up in pulmonology clinic this week.  Pulmonology should call him to schedule that appointment.  Patient agrees with this plan.  He was offered pain medicine but did not want to get any medicine as he wants to use over-the-counter medication.  We discussed not doing exercise, deep inhalation, smoking, vaping, or any airplane flying as recommended by pulmonology.  Patient understood  return precautions and follow instructions and was discharged in stable condition.               Final Clinical Impression(s) / ED Diagnoses Final diagnoses:  Bilateral pneumothoraces  Shortness of breath    Rx / DC Orders ED Discharge Orders     None      Clinical Impression: 1. Bilateral pneumothoraces   2. Shortness of breath     Disposition: Discharge  Condition: Good  I have discussed the results, Dx and Tx plan with the pt(& family if present). He/she/they expressed understanding and agree(s) with the plan. Discharge instructions discussed at great length. Strict return precautions discussed and pt &/or family have verbalized understanding of the instructions. No further questions at time of discharge.    New Prescriptions   No medications on file    Follow Up: pulmonology clinic will call you to schedule an appointment.        Arra Connaughton, Lonni PARAS, MD 02/16/23 1359

## 2023-02-16 NOTE — ED Triage Notes (Signed)
 Pt presents with c/o pain when breathing in, states he feels air trapped in his chest X 2 days.   States he recently had acupuncture done

## 2023-02-16 NOTE — Telephone Encounter (Signed)
 Discussed with on-call ED physician.  Bilateral pneumothoraces.  Acupuncture on Thursday, 48+ hours ago.  Immediate onset of symptoms of pain with acupuncture.  A bit worse today so presented to urgent care.  Chest x-ray reveals very small bilateral apical pneumothoraces.  Repeat chest x-ray several hours later demonstrates stability.  Too small to intervene on.  Discussed strict return precautions, avoiding strenuous exercise, inhalation of any substance, no plane trips etc. that would increase risk of worsening pneumothorax.  Copying clinic staff, please arrange follow-up soonest available appointment needs to be this week, with chest x-ray (ordered) prior to visit for evaluation of pneumothorax, bilateral.  Can be acute visit or any clinic availability with any provider ideally in the next 1 to 3 days, certainly before the end of the week. Earliest open slot I see is Tuesday AM with Lauraine Lites, NP. Other held slots exist.

## 2023-02-16 NOTE — ED Triage Notes (Signed)
 Pt arrived via POV. C/o SOB after having upper back acupuncture done 2x days ago. Pt went to UC and xrays showed bilat apical pneumothorax.  Aox4. NAD

## 2023-02-16 NOTE — Discharge Instructions (Signed)
 Your history, exam and workup today revealed bilateral pneumothorax or partially collapsed lungs.  The x-ray this afternoon appeared similar to the one this morning and after speaking with the lung doctors, they feel you are safe for discharge home given your reassuring vital signs.  That being said, please observe extremely strict return precautions if any symptoms are to change or worsen including worsening chest pain or shortness of breath, please return to the nearest emergency department immediately.  Please avoid smoking, exercise or anything that cause you to inhale deeply.  Please avoid flying.  You may use over-the-counter medications to help with the discomfort and rest and stay hydrated.  The pulmonology clinic should call you to schedule appointment the next few days to get a repeat x-ray.

## 2023-02-18 ENCOUNTER — Ambulatory Visit (INDEPENDENT_AMBULATORY_CARE_PROVIDER_SITE_OTHER): Payer: PRIVATE HEALTH INSURANCE

## 2023-02-18 ENCOUNTER — Telehealth: Payer: Self-pay | Admitting: Pulmonary Disease

## 2023-02-18 ENCOUNTER — Ambulatory Visit (INDEPENDENT_AMBULATORY_CARE_PROVIDER_SITE_OTHER): Payer: PRIVATE HEALTH INSURANCE | Admitting: Acute Care

## 2023-02-18 ENCOUNTER — Encounter: Payer: Self-pay | Admitting: Acute Care

## 2023-02-18 ENCOUNTER — Telehealth: Payer: Self-pay | Admitting: Acute Care

## 2023-02-18 ENCOUNTER — Encounter: Payer: Self-pay | Admitting: Pulmonary Disease

## 2023-02-18 VITALS — BP 112/62 | HR 71 | Ht 67.0 in | Wt 180.0 lb

## 2023-02-18 DIAGNOSIS — Z8709 Personal history of other diseases of the respiratory system: Secondary | ICD-10-CM | POA: Diagnosis not present

## 2023-02-18 DIAGNOSIS — R0602 Shortness of breath: Secondary | ICD-10-CM | POA: Diagnosis not present

## 2023-02-18 DIAGNOSIS — J95811 Postprocedural pneumothorax: Secondary | ICD-10-CM | POA: Diagnosis not present

## 2023-02-18 NOTE — Telephone Encounter (Signed)
I have returned the patient's call.  We reviewed his chest x-ray results again.  We discussed how quickly he should return to weightlifting and working out.  I have asked him not to lift weights for a week.  And at that point in time I have asked him to slowly increase his activity.  I answered further questions about the possibility of recurrence of pneumothoraces.  We discussed that the likelihood of a spontaneous pneumothorax is low as we know the cause of this was directly related to acupuncture.  I have advised him to have  no further  acupuncture to his back or neck areas.  I have advised him to seek emergency care for chest pain, shortness of breath, or sensation of bubbles in his chest area. He has verbalized understanding and had no further questions at completion of the phone call. I have told him to follow-up with [place] if he has any further questions.

## 2023-02-18 NOTE — Patient Instructions (Addendum)
It is good to see you today. I have ordered a CT Chest to better evaluate the pain you are having. You will get a call to get this scheduled. I am waiting on your insurance to approve this. I am waiting for the CXR you had done today to get read.  No strenuous activity x 7 days. No lifting weights x 7-10 days. Seek emergency care for any increasing shortness of breath, or worsening pain.  Call if you need [place] sooner  Please contact office for sooner follow up if symptoms do not improve or worsen or seek emergency care

## 2023-02-18 NOTE — Telephone Encounter (Signed)
Called and spoke to patient. He stated that he was instructed to go to the ED for chest pain. He wanted to verify that CXR was in fact clear and did not show a pneumothorax.I have relayed below results to patient.  He is wanting to know when he will be able to resume exercise.   He feels that he was not  given much information. He is requesting a call from Maralyn Sago directly to discuss further.   No more air, no pneumothorax on repeat CXR.

## 2023-02-18 NOTE — Telephone Encounter (Signed)
Patient checking on message sent. Patient phone number is [phone].

## 2023-02-18 NOTE — Telephone Encounter (Signed)
Patient seen 2/11 by SG. States his CXR came back clear, but that he is still in a lot of pain in his chest. He was advised to go to ED, but he wanted to ask questions prior to that. 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 He states he is still SOB and in pain. Please advise.

## 2023-02-18 NOTE — Telephone Encounter (Signed)
Lm for patient.

## 2023-02-18 NOTE — Progress Notes (Signed)
History of Present Illness Frederick Reynolds is a 28 y.o. male with past medical history significant for asthma, and irregular heartbeat.  He was seen by the Dr. Judeth Horn in the ER 02/16/2023 for shortness of breath.  He is here today for follow-up chest x-ray.  Synopsis 28 year old  male who had acupuncture 02/13/2023 and developed the sensation of bubbles in his chest and pain in his shoulder blades.he was using ibuprofen for pain management which did help.  He presented to the ED 02/16/2023, with shortness of breath, and chest tightness.  CXR showed bilateral pneumothoraces.  Patient is a long-term user of acupuncture, went to his long-term provider, who used a longer needle than usual.  Patient went to the ER on a Thursday afternoon and by Friday morning was having chest pain.  He went to urgent care where an x-ray was done, he was diagnosed with bilateral apical pneumothoraces and was transferred to [facility] for evaluation.  While at [facility] pulmonary consult was called and the inpatient team reviewed the patient's case.  Patient was given the option of admission versus discharge with close observation, and follow-up chest x-ray as an outpatient.  He was given strict return precautions.  At the time it was not felt that CT scan of the chest was necessary as patient's vital signs were stable.  He was advised not to engage in exercise, deep inhalation, smoking, vaping, and not to travel on an airplane.  He is here today for follow-up chest x-ray to reassess the bilateral small pneumothoraces.   02/18/2023 Patient presents for follow-up.  He continued to endorse shortness of breath, difficulty taking a deep breath, and pain left greater than right , worse in the morning and in cold weather.  He has been managing this with ibuprofen.   Initially chest x-ray that had been done earlier in the day was not read by radiology.  The patient is a Careers information officer at [employer] and has been relatively stressed  about missing class to have this evaluated. As he was stable, I told him that I would give him a call once I get the results of the chest x-ray back.  Plan was to do a CT chest if pneumothoraces had not resolved.  Chest x-ray showed no visible remaining pneumothorax,  lungs were clear bilaterally, there was no pleural fluid heart size was normal with normal mediastinal contours. Again patient was advised not to exert himself with activity or lift weights for a week.  Again he was given strict return to ER criteria including shortness of breath, subcutaneous emphysema, chest pain. He verbalized understanding of the above and had no further questions at completion of the office visit. As there is resolution of the pneumothoraces on imaging ,there is no need for CT imaging.   Test Results: CXR 02/18/2023 No visible remaining pneumothorax. The lungs are clear. No pleural fluid. Normal heart size and mediastinal contours.   IMPRESSION: Negative chest.  No visible pneumothorax.  CXR 02/16/2023 The heart size and mediastinal contours are within normal limits. Normal pulmonary vascularity. Trace bilateral apical pneumothoraces. No focal consolidation or pleural effusion. No acute osseous abnormality.   IMPRESSION: 1. Trace bilateral apical pneumothoraces.          No data to display              No data to display          BNP No results found for: "BNP"  ProBNP No results found  for: "PROBNP"  PFT No results found for: "FEV1PRE", "FEV1POST", "FVCPRE", "FVCPOST", "TLC", "DLCOUNC", "PREFEV1FVCRT", "PSTFEV1FVCRT"  DG Chest 2 View Result Date: 02/16/2023 CLINICAL DATA:  Follow-up bilateral pneumothoraces EXAM: CHEST - 2 VIEW COMPARISON:  Chest radiograph from earlier today. FINDINGS: Stable cardiomediastinal silhouette with normal heart size. Stable small bilateral apical pneumothoraces. No pleural effusion. Lungs appear clear, with no acute consolidative airspace disease and no  pulmonary edema. IMPRESSION: Stable small bilateral apical pneumothoraces. Electronically Signed   By: Delbert Phenix M.D.   On: 02/16/2023 11:51   DG Chest 2 View Result Date: 02/16/2023 CLINICAL DATA:  Shortness of breath after acupuncture. EXAM: CHEST - 2 VIEW COMPARISON:  Chest x-ray dated October 09, 2020. FINDINGS: The heart size and mediastinal contours are within normal limits. Normal pulmonary vascularity. Trace bilateral apical pneumothoraces. No focal consolidation or pleural effusion. No acute osseous abnormality. IMPRESSION: 1. Trace bilateral apical pneumothoraces. Electronically Signed   By: Obie Dredge M.D.   On: 02/16/2023 10:08     Past medical hx Past Medical History:  Diagnosis Date   Asthma    Irregular heart beat      Social History   Tobacco Use   Smoking status: Some Days    Types: Cigars   Smokeless tobacco: Never  Vaping Use   Vaping status: Never Used  Substance Use Topics   Alcohol use: Not Currently   Drug use: Never    Mr.Borchers reports that he has been smoking cigars. He has never used smokeless tobacco. He reports that he does not currently use alcohol. He reports that he does not use drugs.  Tobacco Cessation: Ready to quit: Not Answered Counseling given: Not Answered Current cigar smoker   Past surgical hx, Family hx, Social hx all reviewed.  Current Outpatient Medications on File Prior to Visit  Medication Sig   ALBUTEROL IN Inhale 2 puffs into the lungs.   amoxicillin-clavulanate (AUGMENTIN) 875-125 MG tablet Take 1 tablet by mouth every 12 (twelve) hours.   Fluticasone-Umeclidin-Vilant (TRELEGY ELLIPTA) 200-62.5-25 MCG/INH AEPB Inhale 1 puff into the lungs daily.   hydrOXYzine (ATARAX) 25 MG tablet Take 0.5-1 tablets (12.5-25 mg total) by mouth every 8 (eight) hours as needed for itching.   ibuprofen (ADVIL) 200 MG tablet Take 200 mg by mouth every 6 (six) hours as needed for moderate pain (pain score 4-6).   meclizine (ANTIVERT) 25  MG tablet Take 1 tablet (25 mg total) by mouth 2 (two) times daily as needed for dizziness or nausea.   naproxen (NAPROSYN) 375 MG tablet Take 1 tablet (375 mg total) by mouth 2 (two) times daily.   ondansetron (ZOFRAN-ODT) 4 MG disintegrating tablet Take 1 tablet (4 mg total) by mouth every 8 (eight) hours as needed for nausea or vomiting.   predniSONE (DELTASONE) 20 MG tablet Take 2 tablets daily with breakfast.   No current facility-administered medications on file prior to visit.     No Active Allergies  Review Of Systems:  Constitutional:   No  weight loss, night sweats,  Fevers, chills, fatigue, or  lassitude.  HEENT:   No headaches,  Difficulty swallowing,  Tooth/dental problems, or  Sore throat,                No sneezing, itching, ear ache, nasal congestion, post nasal drip,   CV:  + chest pain,  Orthopnea, PND, swelling in lower extremities, anasarca, dizziness, palpitations, syncope.   GI  No heartburn, indigestion, abdominal pain, nausea, vomiting, diarrhea, change in bowel  habits, loss of appetite, bloody stools.   Resp: + shortness of breath with exertion and at rest.  No excess mucus, no productive cough,  No non-productive cough,  No coughing up of blood.  No change in color of mucus.  No wheezing.  No chest wall deformity  Skin: no rash or lesions.  GU: no dysuria, change in color of urine, no urgency or frequency.  No flank pain, no hematuria   MS:  No joint pain or swelling.  No decreased range of motion.  + back pain.  Psych:  No change in mood or affect. No depression + anxiety.  No memory loss.   Vital Signs BP 112/62   Pulse 71   Ht 5\' 7"  (1.702 m)   Wt 180 lb (81.6 kg)   SpO2 98% Comment: room air  BMI 28.19 kg/m    Physical Exam:  General- No distress,  A&Ox3, pleasant, anxious ENT: No sinus tenderness, TM clear, pale nasal mucosa, no oral exudate,no post nasal drip, no LAN, no tracheal deviation Cardiac: S1, S2, regular rate and rhythm, no  murmur Chest: No wheeze/ rales/ dullness; no accessory muscle use, no nasal flaring, no sternal retractions, no subcutaneous emphysema Abd.: Soft Non-tender, nondistended, bowel sounds positive,Body mass index is 28.19 kg/m.  Ext: No clubbing cyanosis, edema, no obvious deformities Neuro:  normal strength, moving all extremities x 4, alert and oriented x 3, appropriate  Skin: No rashes, warm and dry, no obvious lesions Psych: normal mood and behavior, anxious   Assessment/Plan Bilateral small apical pneumothoraces secondary to acupuncture treatment Resolved without intervention Follow-up chest x-ray confirms resolution of pneumothoraces Plan The bilateral pneumothoraces show resolution on chest x-ray No further follow-up is needed unless she develops symptoms No strenuous activity For 7 days No lifting weights, for 7 to 10 days. Seek emergency care for any increasing shortness of breath, or worsening pain.  Call if you need [place] sooner  Please contact office for sooner follow up if symptoms do not improve or worsen or seek emergency care    I spent 50 minutes dedicated to the care of this patient on the date of this encounter to include pre-visit review of records, face-to-face time with the patient discussing conditions above, post visit ordering of testing, clinical documentation with the electronic health record, making appropriate referrals as documented, and communicating necessary information to the patient's healthcare team.    Bevelyn Ngo, NP 02/18/2023  9:15 AM

## 2023-02-19 NOTE — Telephone Encounter (Signed)
Noted by triage.

## 2023-02-19 NOTE — Telephone Encounter (Signed)
Noted

## 2023-07-18 ENCOUNTER — Other Ambulatory Visit: Payer: Self-pay

## 2023-07-18 ENCOUNTER — Ambulatory Visit
Admission: EM | Admit: 2023-07-18 | Discharge: 2023-07-18 | Disposition: A | Discharge status: Home / Self Care | Attending: Family Medicine | Admitting: Family Medicine

## 2023-07-18 DIAGNOSIS — I440 Atrioventricular block, first degree: Secondary | ICD-10-CM

## 2023-07-18 DIAGNOSIS — H6990 Unspecified Eustachian tube disorder, unspecified ear: Secondary | ICD-10-CM | POA: Diagnosis not present

## 2023-07-18 DIAGNOSIS — I499 Cardiac arrhythmia, unspecified: Secondary | ICD-10-CM | POA: Diagnosis not present

## 2023-07-18 DIAGNOSIS — R42 Dizziness and giddiness: Secondary | ICD-10-CM

## 2023-07-18 HISTORY — DX: Attention-deficit hyperactivity disorder, unspecified type: F90.9

## 2023-07-18 MED ORDER — CETIRIZINE HCL 10 MG PO TABS: 10.0000 mg | ORAL_TABLET | Freq: Every day | ORAL | 0 refills | Status: AC

## 2023-07-18 MED ORDER — FLUTICASONE PROPIONATE 50 MCG/ACT NA SUSP: 2.0000 | Freq: Every day | NASAL | 0 refills | Status: AC

## 2023-07-18 NOTE — ED Provider Notes (Signed)
 [facility]  Note:  This document was prepared using Conservation officer, historic buildings and may include unintentional dictation errors.  MRN: [id number] DOB: 22-Mar-1995  Subjective:   Frederick Reynolds is a 28 y.o. male presenting for longstanding history of intermittent episodes of racing heartbeat, palpitations.  Today he had an episode that lasted much longer than he is used to and was associated with chills.  Has previously had cardiology workup more than a year ago.  Reports that he had a Holter monitor placed but did not lead to anything.  Has not followed up since.  Denies active chest pain, shortness of breath.  Has had intermittent vertigo with tingling at the back of his head.  Has a history of ear infections as a child.  Has some allergies but does not take anything consistently for this.  He also does jujitsu.  No drug use.  No alcohol use.  No history of atrial fibrillation, atrial flutter, SVT or other established arrhythmias.  No headache, confusion, weakness, facial drooping, difficulty with speech.  No current facility-administered medications for this encounter.  Current Outpatient Medications:    ALBUTEROL  IN, Inhale 2 puffs into the lungs., Disp: , Rfl:    amoxicillin -clavulanate (AUGMENTIN ) 875-125 MG tablet, Take 1 tablet by mouth every 12 (twelve) hours., Disp: 14 tablet, Rfl: 0   Fluticasone -Umeclidin-Vilant (TRELEGY ELLIPTA ) 200-62.5-25 MCG/INH AEPB, Inhale 1 puff into the lungs daily., Disp: 14 each, Rfl: 0   hydrOXYzine  (ATARAX ) 25 MG tablet, Take 0.5-1 tablets (12.5-25 mg total) by mouth every 8 (eight) hours as needed for itching., Disp: 30 tablet, Rfl: 0   ibuprofen (ADVIL) 200 MG tablet, Take 200 mg by mouth every 6 (six) hours as needed for moderate pain (pain score 4-6)., Disp: , Rfl:    meclizine  (ANTIVERT ) 25 MG tablet, Take 1 tablet (25 mg total) by mouth 2 (two) times daily as needed for dizziness or nausea., Disp: 9 tablet, Rfl: 0    naproxen  (NAPROSYN ) 375 MG tablet, Take 1 tablet (375 mg total) by mouth 2 (two) times daily., Disp: 20 tablet, Rfl: 0   ondansetron  (ZOFRAN -ODT) 4 MG disintegrating tablet, Take 1 tablet (4 mg total) by mouth every 8 (eight) hours as needed for nausea or vomiting., Disp: 20 tablet, Rfl: 0   predniSONE  (DELTASONE ) 20 MG tablet, Take 2 tablets daily with breakfast., Disp: 10 tablet, Rfl: 0   No Active Allergies  Past Medical History:  Diagnosis Date   Asthma    Irregular heart beat      Past Surgical History:  Procedure Laterality Date   retina rupture       Family History  Problem Relation Age of Onset   Hypertension Father    Healthy Mother     Social History   Tobacco Use   Smoking status: Some Days    Types: Cigars   Smokeless tobacco: Never  Vaping Use   Vaping status: Never Used  Substance Use Topics   Alcohol use: Not Currently   Drug use: Never    ROS   Objective:   Vitals: There were no vitals taken for this visit.  Physical Exam Constitutional:      General: He is not in acute distress.    Appearance: Normal appearance. He is well-developed and normal weight. He is not ill-appearing, toxic-appearing or diaphoretic.  HENT:     Head: Normocephalic and atraumatic.     Right Ear: Ear canal and external ear normal. There is no impacted cerumen.  Left Ear: Ear canal and external ear normal. There is no impacted cerumen.     Ears:     Comments: Mild bilateral effusions.    Nose: Nose normal.     Mouth/Throat:     Mouth: Mucous membranes are moist.     Pharynx: Oropharynx is clear.  Eyes:     General: No scleral icterus.       Right eye: No discharge.        Left eye: No discharge.     Extraocular Movements: Extraocular movements intact.  Cardiovascular:     Rate and Rhythm: Normal rate and regular rhythm.     Heart sounds: Normal heart sounds. No murmur heard.    No friction rub. No gallop.  Pulmonary:     Effort: Pulmonary effort is normal. No  respiratory distress.     Breath sounds: Normal breath sounds. No stridor. No wheezing, rhonchi or rales.  Musculoskeletal:     Cervical back: Normal range of motion.  Neurological:     Mental Status: He is alert and oriented to person, place, and time.     Cranial Nerves: No cranial nerve deficit.     Motor: No weakness.     Coordination: Coordination normal.     Gait: Gait normal.  Psychiatric:        Mood and Affect: Mood normal.        Behavior: Behavior normal.        Thought Content: Thought content normal.        Judgment: Judgment normal.    ED ECG REPORT   Date: 07/18/2023  EKG Time: 2:23 PM  Rate: 53bpm  Rhythm: sinus bradycardia,  there are no previous tracings available for comparison  Axis: normal  Intervals:first-degree A-V block   ST&T Change: None  Narrative Interpretation: Sinus bradycardia with sinus arrhythmia, first-degree AV block at 53 bpm.  Unable to access previous EKG from 2021.       Assessment and Plan :   PDMP not reviewed this encounter.  1. Cardiac arrhythmia, unspecified cardiac arrhythmia type   2. First degree heart block   3. Vertigo   4. Eustachian tube dysfunction, unspecified laterality    Had an extensive discussion with patient and ultimately we agreed to pursue referral to a new cardiologist for a second opinion.  This was placed.  For now, no signs of an acute cardiopulmonary process.  Low suspicion for an acute encephalopathy.  Suspect vertigo is multifaceted including possible contributors as allergies, jujitsu (rapid head movements).  Recommend conservative management, start Zyrtec  and Flonase , limit jujitsu activities in general rapid head movements.  Hydrate consistently.  Follow-up with PCP as soon as possible for continued workup.  Patient has meclizine  to use as needed.  Counseled patient on potential for adverse effects with medications prescribed/recommended today, ER and return-to-clinic precautions discussed, patient verbalized  understanding.    Christopher Savannah, PA-C 07/18/23 1425

## 2023-07-18 NOTE — Discharge Instructions (Addendum)
 Start Flonase  and Zyrtec  daily to address possible eustachian tube dysfunction as a possible source of vertigo. You can use meclizine  as needed for your vertigo. Be careful about rapid head movements.   Follow up asap with the cardiology referral.

## 2023-07-18 NOTE — ED Triage Notes (Addendum)
 Pt states was sitting at home and was typing and had an irregular heart beat that lasted followed by chills across entire body and he got real cold. Pt denies chest pain, SOB, vomiting. PT states has had vertigo for a month now that has caused him nausea and recently saw PCP for that. Pt c/o numbness in head, face hands and toes bilat.

## 2023-10-07 ENCOUNTER — Ambulatory Visit
Admission: EM | Admit: 2023-10-07 | Discharge: 2023-10-07 | Disposition: A | Discharge status: Home / Self Care | Source: Ambulatory Visit | Attending: Family Medicine | Admitting: Family Medicine

## 2023-10-07 DIAGNOSIS — R051 Acute cough: Secondary | ICD-10-CM | POA: Diagnosis not present

## 2023-10-07 DIAGNOSIS — B349 Viral infection, unspecified: Secondary | ICD-10-CM | POA: Diagnosis not present

## 2023-10-07 DIAGNOSIS — J4520 Mild intermittent asthma, uncomplicated: Secondary | ICD-10-CM | POA: Diagnosis not present

## 2023-10-07 LAB — POC COVID19/FLU A&B COMBO
Covid Antigen, POC: NEGATIVE
Influenza A Antigen, POC: NEGATIVE
Influenza B Antigen, POC: NEGATIVE

## 2023-10-07 MED ORDER — PREDNISONE 20 MG PO TABS
40.0000 mg | ORAL_TABLET | Freq: Every day | ORAL | 0 refills | Status: AC
Start: 2023-10-07 — End: 2023-10-12

## 2023-10-07 NOTE — ED Triage Notes (Signed)
 I called pt and no answer

## 2023-10-07 NOTE — Discharge Instructions (Signed)
 You tested negative for COVID and flu. Please treat your symptoms with over the counter cough medication, tylenol or ibuprofen, humidifier, and rest.  May take prednisone  daily for 5 days.  Viral illnesses can last 7-14 days. Please follow up with your PCP if your symptoms are not improving. Please go to the ER for any worsening symptoms. This includes but is not limited to fever you can not control with tylenol or ibuprofen, you are not able to stay hydrated, you have shortness of breath or chest pain.  Thank you for choosing [facility] for your healthcare needs. I hope you feel better soon!

## 2023-10-07 NOTE — ED Provider Notes (Signed)
 [facility]    CSN: [id number] Arrival date & time: 10/07/23  1336      History   Chief Complaint No chief complaint on file.   HPI Frederick Reynolds is a 28 y.o. male  presents for evaluation of URI symptoms for 3 days. Patient reports associated symptoms of cough, congestion, headache, chills, nausea. Denies N/V/D, sore throat, documented fevers, ear pain, shortness of breath. Patient does have a hx of asthma.  Does have an inhaler but has not needed to use since symptom onset.  Patient does smoke cigars.   Pt has taken cough medicine OTC for symptoms. Pt has no other concerns at this time.   HPI  Past Medical History:  Diagnosis Date   ADHD    Asthma    Irregular heart beat     Patient Active Problem List   Diagnosis Date Noted   Chronic bronchitis (HCC) 10/09/2020    Past Surgical History:  Procedure Laterality Date   retina rupture          Home Medications    Prior to Admission medications   Medication Sig Start Date End Date Taking? Authorizing Provider  predniSONE  (DELTASONE ) 20 MG tablet Take 2 tablets (40 mg total) by mouth daily with breakfast for 5 days. 10/07/23 10/12/23 Yes Mohamed Portlock, Jodi R, NP  ALBUTEROL  IN Inhale 2 puffs into the lungs.    [provider]  amoxicillin -clavulanate (AUGMENTIN ) 875-125 MG tablet Take 1 tablet by mouth every 12 (twelve) hours. 08/15/22   Enedelia Dorna HERO, FNP  cetirizine  (ZYRTEC  ALLERGY) 10 MG tablet Take 1 tablet (10 mg total) by mouth daily. 07/18/23   Christopher Savannah, PA-C  fluticasone  (FLONASE ) 50 MCG/ACT nasal spray Place 2 sprays into both nostrils daily. 07/18/23   Christopher Savannah, PA-C  Fluticasone -Umeclidin-Vilant (TRELEGY ELLIPTA ) 200-62.5-25 MCG/INH AEPB Inhale 1 puff into the lungs daily. 10/09/20   Kassie Acquanetta Bradley, MD  hydrOXYzine  (ATARAX ) 25 MG tablet Take 0.5-1 tablets (12.5-25 mg total) by mouth every 8 (eight) hours as needed for itching. 02/19/22   Christopher Savannah, PA-C  ibuprofen (ADVIL) 200 MG tablet  Take 200 mg by mouth every 6 (six) hours as needed for moderate pain (pain score 4-6).    [provider]  meclizine  (ANTIVERT ) 25 MG tablet Take 1 tablet (25 mg total) by mouth 2 (two) times daily as needed for dizziness or nausea. 10/25/19   Anitra Rocky KIDD, PA-C  methylphenidate (METADATE CD) 10 MG CR capsule Take 10 mg by mouth every morning. 07/15/23   [provider]  naproxen  (NAPROSYN ) 375 MG tablet Take 1 tablet (375 mg total) by mouth 2 (two) times daily. 08/15/22   Enedelia Dorna HERO, FNP  ondansetron  (ZOFRAN -ODT) 4 MG disintegrating tablet Take 1 tablet (4 mg total) by mouth every 8 (eight) hours as needed for nausea or vomiting. 02/20/22   Joesph Shaver Scales, PA-C    Family History Family History  Problem Relation Age of Onset   Hypertension Father    Healthy Mother     Social History Social History   Tobacco Use   Smoking status: Some Days    Types: Cigars   Smokeless tobacco: Never  Vaping Use   Vaping status: Never Used  Substance Use Topics   Alcohol use: Not Currently   Drug use: Never     Allergies   Patient has no known allergies.   Review of Systems Review of Systems  Constitutional:  Positive for chills.  HENT:  Positive for congestion.  Respiratory:  Positive for cough.   Gastrointestinal:  Positive for nausea.  Neurological:  Positive for headaches.     Physical Exam Triage Vital Signs ED Triage Vitals [10/07/23 1457]  Encounter Vitals Group     BP 132/86     Girls Systolic BP Percentile      Girls Diastolic BP Percentile      Boys Systolic BP Percentile      Boys Diastolic BP Percentile      Pulse Rate 60     Resp 18     Temp 98.8 F (37.1 C)     Temp Source Oral     SpO2 96 %     Weight      Height      Head Circumference      Peak Flow      Pain Score 6     Pain Loc      Pain Education      Exclude from Growth Chart    No data found.  Updated Vital Signs BP 132/86 (BP Location: Left Arm)   Pulse 60    Temp 98.8 F (37.1 C) (Oral)   Resp 18   SpO2 96%   Visual Acuity Right Eye Distance:   Left Eye Distance:   Bilateral Distance:    Right Eye Near:   Left Eye Near:    Bilateral Near:     Physical Exam Vitals and nursing note reviewed.  Constitutional:      General: He is not in acute distress.    Appearance: Normal appearance. He is not ill-appearing or toxic-appearing.  HENT:     Head: Normocephalic and atraumatic.     Right Ear: Tympanic membrane and ear canal normal.     Left Ear: Tympanic membrane and ear canal normal.     Nose: Congestion present.     Mouth/Throat:     Mouth: Mucous membranes are moist.     Pharynx: No oropharyngeal exudate or posterior oropharyngeal erythema.  Eyes:     Pupils: Pupils are equal, round, and reactive to light.  Cardiovascular:     Rate and Rhythm: Normal rate and regular rhythm.     Heart sounds: Normal heart sounds.  Pulmonary:     Effort: Pulmonary effort is normal.     Breath sounds: No wheezing or rhonchi.  Musculoskeletal:     Cervical back: Normal range of motion and neck supple.  Lymphadenopathy:     Cervical: No cervical adenopathy.  Skin:    General: Skin is warm and dry.  Neurological:     General: No focal deficit present.     Mental Status: He is alert and oriented to person, place, and time.  Psychiatric:        Mood and Affect: Mood normal.        Behavior: Behavior normal.      UC Treatments / Results  Labs (all labs ordered are listed, but only abnormal results are displayed) Labs Reviewed  POC COVID19/FLU A&B COMBO    EKG   Radiology No results found.  Procedures Procedures (including critical care time)  Medications Ordered in UC Medications - No data to display  Initial Impression / Assessment and Plan / UC Course  I have reviewed the triage vital signs and the nursing notes.  Pertinent labs & imaging results that were available during my care of the patient were reviewed by me and  considered in my medical decision making (see chart for details).  Reviewed exam and symptoms with patient.  No red flags.  Negative COVID and flu testing.  Discussed viral illness and symptomatic treatment.  Will do prednisone  for his asthma.  He has an inhaler to use as needed.  Encouraged rest fluids and PCP follow-up if symptoms do not improve.  ER precautions reviewed Final Clinical Impressions(s) / UC Diagnoses   Final diagnoses:  Acute cough  Viral illness  Mild intermittent asthma, unspecified whether complicated     Discharge Instructions      You tested negative for COVID and flu. Please treat your symptoms with over the counter cough medication, tylenol or ibuprofen, humidifier, and rest.  May take prednisone  daily for 5 days.  Viral illnesses can last 7-14 days. Please follow up with your PCP if your symptoms are not improving. Please go to the ER for any worsening symptoms. This includes but is not limited to fever you can not control with tylenol or ibuprofen, you are not able to stay hydrated, you have shortness of breath or chest pain.  Thank you for choosing [facility] for your healthcare needs. I hope you feel better soon!      ED Prescriptions     Medication Sig Dispense Auth. Provider   predniSONE  (DELTASONE ) 20 MG tablet Take 2 tablets (40 mg total) by mouth daily with breakfast for 5 days. 10 tablet Nickola Lenig, Jodi R, NP      PDMP not reviewed this encounter.   Loreda Myla SAUNDERS, NP 10/07/23 [id number]

## 2023-10-07 NOTE — ED Triage Notes (Signed)
 Pt present with concerns of flu and COVID symptoms. Pt states he has headaches, chills, nasal congestion and states he has felt nauseated.
